# Patient Record
Sex: Female | Born: 1995 | Hispanic: Yes | Smoking: Never smoker
Health system: Southern US, Community
[De-identification: ages and names within clinical notes are randomized; demographics above are authoritative.]

## PROBLEM LIST (undated history)

## (undated) ENCOUNTER — Inpatient Hospital Stay: Payer: Self-pay

## (undated) DIAGNOSIS — O24419 Gestational diabetes mellitus in pregnancy, unspecified control: Secondary | ICD-10-CM

## (undated) DIAGNOSIS — O200 Threatened abortion: Secondary | ICD-10-CM

## (undated) DIAGNOSIS — Z87440 Personal history of urinary (tract) infections: Secondary | ICD-10-CM

## (undated) DIAGNOSIS — Z8616 Personal history of COVID-19: Secondary | ICD-10-CM

## (undated) HISTORY — DX: Personal history of COVID-19: Z86.16

## (undated) HISTORY — DX: Personal history of urinary (tract) infections: Z87.440

## (undated) HISTORY — DX: Gestational diabetes mellitus in pregnancy, unspecified control: O24.419

## (undated) HISTORY — DX: Threatened abortion: O20.0

---
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
## 2014-10-06 ENCOUNTER — Emergency Department: Payer: Self-pay | Admitting: Emergency Medicine

## 2014-10-06 LAB — URINALYSIS, COMPLETE
BLOOD: NEGATIVE
Bilirubin,UR: NEGATIVE
Glucose,UR: NEGATIVE mg/dL (ref 0–75)
NITRITE: NEGATIVE
Ph: 6 (ref 4.5–8.0)
Protein: 100
Specific Gravity: 1.029 (ref 1.003–1.030)
Squamous Epithelial: 35
WBC UR: 26 /HPF (ref 0–5)

## 2014-10-06 LAB — CBC WITH DIFFERENTIAL/PLATELET
BASOS PCT: 0.6 %
Basophil #: 0.1 10*3/uL (ref 0.0–0.1)
Eosinophil #: 0 10*3/uL (ref 0.0–0.7)
Eosinophil %: 0.2 %
HCT: 37.6 % (ref 35.0–47.0)
HGB: 12.4 g/dL (ref 12.0–16.0)
LYMPHS PCT: 16.6 %
Lymphocyte #: 2.2 10*3/uL (ref 1.0–3.6)
MCH: 27.1 pg (ref 26.0–34.0)
MCHC: 32.9 g/dL (ref 32.0–36.0)
MCV: 82 fL (ref 80–100)
MONO ABS: 0.8 x10 3/mm (ref 0.2–0.9)
Monocyte %: 6.1 %
NEUTROS ABS: 10.3 10*3/uL — AB (ref 1.4–6.5)
NEUTROS PCT: 76.5 %
Platelet: 337 10*3/uL (ref 150–440)
RBC: 4.58 10*6/uL (ref 3.80–5.20)
RDW: 14.3 % (ref 11.5–14.5)
WBC: 13.5 10*3/uL — AB (ref 3.6–11.0)

## 2014-10-06 LAB — COMPREHENSIVE METABOLIC PANEL
ALBUMIN: 4.2 g/dL (ref 3.8–5.6)
ANION GAP: 9 (ref 7–16)
AST: 20 U/L (ref 0–26)
Alkaline Phosphatase: 66 U/L
BILIRUBIN TOTAL: 1.1 mg/dL — AB (ref 0.2–1.0)
BUN: 9 mg/dL (ref 7–18)
CHLORIDE: 106 mmol/L (ref 98–107)
CO2: 26 mmol/L (ref 21–32)
Calcium, Total: 8.5 mg/dL — ABNORMAL LOW (ref 9.0–10.7)
Creatinine: 0.68 mg/dL (ref 0.60–1.30)
GLUCOSE: 118 mg/dL — AB (ref 65–99)
Osmolality: 281 (ref 275–301)
POTASSIUM: 3.8 mmol/L (ref 3.5–5.1)
SGPT (ALT): 21 U/L
SODIUM: 141 mmol/L (ref 136–145)
Total Protein: 7.9 g/dL (ref 6.4–8.6)

## 2014-10-06 LAB — TROPONIN I

## 2014-10-06 LAB — LIPASE, BLOOD: Lipase: 143 U/L (ref 73–393)

## 2014-11-21 NOTE — L&D Delivery Note (Signed)
Obstetrical Delivery Note   Date of Delivery:   09/19/2015 Primary OB:   [facility] Gestational Age/EDD: 4467w3d Antepartum complications: GBS bacteruria, history of gonorrhea/chlamydia with negative test of cure, BMI 36  Delivered By:   Cornelia Copaharlie Gredmarie Delange, Jr. MD  Delivery Type:   spontaneous vaginal delivery  Delivery Details:   CTSP and fetus +4. Over two contractions, patient easily delivered infant, with nuchal cord x 1 reduced in the process. Peds present at delivery due to moderate meconium on prior AROM and cord immediately clamped and cut by father of baby. Left periurethral repaired with 3-0 vicryl in running fashion after 1% lidocaine infiltration. Anesthesia:    local Intrapartum complications: None GBS:    Positive and she received at least two doses of ampicillin Laceration:    Right periurethral (repaired) Episiotomy:    none Placenta:    Delivered and expressed via active management. Intact: yes. To pathology: no.  Estimated Blood Loss:  200mL  Baby:    Liveborn female, APGARs 7/9, weight 3360gm  Cornelia Copaharlie Dione Petron, Jr. MD [facility] Pager [phone]

## 2015-02-10 DIAGNOSIS — E663 Overweight: Secondary | ICD-10-CM | POA: Insufficient documentation

## 2015-02-10 LAB — OB RESULTS CONSOLE GBS: STREP GROUP B AG: POSITIVE

## 2015-02-11 LAB — OB RESULTS CONSOLE HEPATITIS B SURFACE ANTIGEN: Hepatitis B Surface Ag: NEGATIVE

## 2015-02-11 LAB — OB RESULTS CONSOLE RPR: RPR: NONREACTIVE

## 2015-02-11 LAB — OB RESULTS CONSOLE HIV ANTIBODY (ROUTINE TESTING): HIV: NONREACTIVE

## 2015-04-01 LAB — OB RESULTS CONSOLE VARICELLA ZOSTER ANTIBODY, IGG: Varicella: IMMUNE

## 2015-09-18 ENCOUNTER — Inpatient Hospital Stay
Admission: EM | Admit: 2015-09-18 | Discharge: 2015-09-21 | DRG: 775 | Disposition: A | Discharge status: Home / Self Care | Payer: Medicaid Other | Attending: Obstetrics and Gynecology | Admitting: Obstetrics and Gynecology

## 2015-09-18 ENCOUNTER — Observation Stay
Admission: EM | Admit: 2015-09-18 | Discharge: 2015-09-18 | Disposition: A | Discharge status: Home / Self Care | Payer: Medicaid Other | Source: Home / Self Care | Admitting: Obstetrics & Gynecology

## 2015-09-18 ENCOUNTER — Encounter: Payer: Self-pay | Admitting: *Deleted

## 2015-09-18 DIAGNOSIS — O99824 Streptococcus B carrier state complicating childbirth: Secondary | ICD-10-CM | POA: Diagnosis present

## 2015-09-18 DIAGNOSIS — Z3A40 40 weeks gestation of pregnancy: Secondary | ICD-10-CM

## 2015-09-18 DIAGNOSIS — O480 Post-term pregnancy: Secondary | ICD-10-CM | POA: Diagnosis present

## 2015-09-18 DIAGNOSIS — Z3493 Encounter for supervision of normal pregnancy, unspecified, third trimester: Secondary | ICD-10-CM

## 2015-09-18 LAB — TYPE AND SCREEN
ABO/RH(D): O POS
ANTIBODY SCREEN: NEGATIVE

## 2015-09-18 LAB — CBC
HCT: 33.2 % — ABNORMAL LOW (ref 35.0–47.0)
Hemoglobin: 10.9 g/dL — ABNORMAL LOW (ref 12.0–16.0)
MCH: 24.4 pg — ABNORMAL LOW (ref 26.0–34.0)
MCHC: 32.8 g/dL (ref 32.0–36.0)
MCV: 74.3 fL — ABNORMAL LOW (ref 80.0–100.0)
PLATELETS: 304 10*3/uL (ref 150–440)
RBC: 4.47 MIL/uL (ref 3.80–5.20)
RDW: 16 % — AB (ref 11.5–14.5)
WBC: 12.7 10*3/uL — AB (ref 3.6–11.0)

## 2015-09-18 MED ORDER — OXYTOCIN 40 UNITS IN LACTATED RINGERS INFUSION - SIMPLE MED
INTRAVENOUS | Status: AC
  Filled 2015-09-18: qty 1000

## 2015-09-18 MED ORDER — SODIUM CHLORIDE 0.9 % IV SOLN
2.0000 g | Freq: Once | INTRAVENOUS | Status: AC
  Administered 2015-09-18: 2 g via INTRAVENOUS
  Filled 2015-09-18: qty 2000

## 2015-09-18 MED ORDER — BUTORPHANOL TARTRATE 1 MG/ML IJ SOLN
INTRAMUSCULAR | Status: AC
  Administered 2015-09-18: 1 mg
  Filled 2015-09-18: qty 1

## 2015-09-18 MED ORDER — SODIUM CHLORIDE 0.9 % IJ SOLN
INTRAMUSCULAR | Status: AC
Start: 2015-09-18 — End: 2015-09-19
  Filled 2015-09-18: qty 3

## 2015-09-18 MED ORDER — LACTATED RINGERS IV SOLN
INTRAVENOUS | Status: DC
  Administered 2015-09-18 – 2015-09-19 (×2): via INTRAVENOUS

## 2015-09-18 MED ORDER — SODIUM CHLORIDE 0.9 % IV SOLN
1.0000 g | INTRAVENOUS | Status: DC
  Administered 2015-09-18: 1 g via INTRAVENOUS

## 2015-09-18 MED ORDER — LIDOCAINE HCL (PF) 1 % IJ SOLN
INTRAMUSCULAR | Status: AC
  Administered 2015-09-19: 5 mL via SUBCUTANEOUS
  Filled 2015-09-18: qty 30

## 2015-09-18 MED ORDER — OXYTOCIN 40 UNITS IN LACTATED RINGERS INFUSION - SIMPLE MED: 62.5000 mL/h | INTRAVENOUS | Status: DC

## 2015-09-18 MED ORDER — LIDOCAINE HCL (PF) 1 % IJ SOLN
30.0000 mL | INTRAMUSCULAR | Status: DC | PRN
  Administered 2015-09-19: 5 mL via SUBCUTANEOUS

## 2015-09-18 MED ORDER — CITRIC ACID-SODIUM CITRATE 334-500 MG/5ML PO SOLN: 30.0000 mL | ORAL | Status: DC | PRN

## 2015-09-18 MED ORDER — OXYTOCIN 10 UNIT/ML IJ SOLN
INTRAMUSCULAR | Status: AC
  Filled 2015-09-18: qty 2

## 2015-09-18 MED ORDER — MISOPROSTOL 200 MCG PO TABS
ORAL_TABLET | ORAL | Status: AC
  Filled 2015-09-18: qty 4

## 2015-09-18 MED ORDER — LACTATED RINGERS IV SOLN: 500.0000 mL | INTRAVENOUS | Status: DC | PRN

## 2015-09-18 MED ORDER — OXYTOCIN BOLUS FROM INFUSION
500.0000 mL | INTRAVENOUS | Status: DC
  Administered 2015-09-19: 500 mL via INTRAVENOUS

## 2015-09-18 MED ORDER — AMPICILLIN SODIUM 1 G IJ SOLR
INTRAMUSCULAR | Status: AC
  Administered 2015-09-18: 1 g via INTRAVENOUS
  Filled 2015-09-18: qty 1000

## 2015-09-18 MED ORDER — AMMONIA AROMATIC IN INHA
RESPIRATORY_TRACT | Status: AC
  Filled 2015-09-18: qty 10

## 2015-09-18 MED ORDER — BUTORPHANOL TARTRATE 1 MG/ML IJ SOLN: 1.0000 mg | Freq: Once | INTRAMUSCULAR | Status: DC

## 2015-09-18 MED ORDER — ONDANSETRON HCL 4 MG/2ML IJ SOLN: 4.0000 mg | Freq: Four times a day (QID) | INTRAMUSCULAR | Status: DC | PRN

## 2015-09-18 MED ORDER — ACETAMINOPHEN 325 MG PO TABS: 650.0000 mg | ORAL_TABLET | ORAL | Status: DC | PRN

## 2015-09-18 NOTE — Discharge Summary (Signed)
Patient presented for evaluation of labor.  Patient had cervical exam by RN and this was reported to me. I reviewed her vital signs and fetal tracing, both of which were reassuring.  Patient was discharged as she was not laboring.  NST interpretation: Reactive, category 1  Ranae Plumberhelsea Mischa Brittingham, MD Attending Obstetrician and Gynecologist [facility]

## 2015-09-18 NOTE — H&P (Addendum)
OB H&P 19 y/o G1 @ 40/2, BMI 36, h/o GC/CT with TOC negative x 2  Office H&P reviewed and updates below Per RN, patient if 5cm. Category I NST (?one variable vs maternal), q1-4748m UCs Start amp for gbs bacteruria Pt declines pain medications EFW 3400gm Consider AROM if not progressing after amp #2.  Anticipate SVD  Mallory Shields, Jr MD [facility]  Pager: [phone]

## 2015-09-18 NOTE — OB Triage Note (Signed)
Discharge instructions given and pt made aware of induction appt for Nov 1 at 2000. Pt stated she understood. AVS given, no further questions at this time.

## 2015-09-18 NOTE — OB Triage Note (Addendum)
[facility] starting at 2100 on 09/17/15 having them 5 mins apart. Pt oriented to room and updated on POC. Pt stated last mean was at 1800. No ROM. Last cervical exam was 09/16/2015 and was dilated to 3 cm per pt report. Pt has  Had vaginal spotting since 0400 on 09/16/2015 and was seen at the office.   Will cont to monitor and update on POC.

## 2015-09-19 ENCOUNTER — Encounter: Payer: Self-pay | Admitting: Obstetrics and Gynecology

## 2015-09-19 LAB — ABO/RH: ABO/RH(D): O POS

## 2015-09-19 MED ORDER — ACETAMINOPHEN 325 MG PO TABS: 650.0000 mg | ORAL_TABLET | ORAL | Status: DC | PRN

## 2015-09-19 MED ORDER — MEASLES, MUMPS & RUBELLA VAC ~~LOC~~ INJ
0.5000 mL | INJECTION | Freq: Once | SUBCUTANEOUS | Status: AC
  Administered 2015-09-21: 0.5 mL via SUBCUTANEOUS
  Filled 2015-09-19 (×6): qty 0.5

## 2015-09-19 MED ORDER — MAGNESIUM HYDROXIDE 400 MG/5ML PO SUSP: 30.0000 mL | ORAL | Status: DC | PRN

## 2015-09-19 MED ORDER — LANOLIN HYDROUS EX OINT: TOPICAL_OINTMENT | CUTANEOUS | Status: DC | PRN

## 2015-09-19 MED ORDER — WITCH HAZEL-GLYCERIN EX PADS: 1.0000 "application " | MEDICATED_PAD | CUTANEOUS | Status: DC | PRN

## 2015-09-19 MED ORDER — ONDANSETRON HCL 4 MG/2ML IJ SOLN: 4.0000 mg | INTRAMUSCULAR | Status: DC | PRN

## 2015-09-19 MED ORDER — OXYCODONE-ACETAMINOPHEN 5-325 MG PO TABS: 1.0000 | ORAL_TABLET | Freq: Four times a day (QID) | ORAL | Status: DC | PRN

## 2015-09-19 MED ORDER — PRENATAL MULTIVITAMIN CH
1.0000 | ORAL_TABLET | Freq: Every day | ORAL | Status: DC
  Administered 2015-09-19 – 2015-09-21 (×3): 1 via ORAL
  Filled 2015-09-19 (×3): qty 1

## 2015-09-19 MED ORDER — IBUPROFEN 600 MG PO TABS
600.0000 mg | ORAL_TABLET | Freq: Four times a day (QID) | ORAL | Status: DC
  Administered 2015-09-19 – 2015-09-21 (×7): 600 mg via ORAL
  Filled 2015-09-19 (×7): qty 1

## 2015-09-19 MED ORDER — DIBUCAINE 1 % RE OINT: 1.0000 "application " | TOPICAL_OINTMENT | RECTAL | Status: DC | PRN

## 2015-09-19 MED ORDER — DIPHENHYDRAMINE HCL 25 MG PO CAPS: 25.0000 mg | ORAL_CAPSULE | Freq: Four times a day (QID) | ORAL | Status: DC | PRN

## 2015-09-19 MED ORDER — SIMETHICONE 80 MG PO CHEW: 80.0000 mg | CHEWABLE_TABLET | ORAL | Status: DC | PRN

## 2015-09-19 MED ORDER — ONDANSETRON HCL 4 MG PO TABS: 4.0000 mg | ORAL_TABLET | ORAL | Status: DC | PRN

## 2015-09-19 MED ORDER — INFLUENZA VAC SPLIT QUAD 0.5 ML IM SUSY: 0.5000 mL | PREFILLED_SYRINGE | INTRAMUSCULAR | Status: DC

## 2015-09-19 MED ORDER — BENZOCAINE-MENTHOL 20-0.5 % EX AERO
1.0000 "application " | INHALATION_SPRAY | CUTANEOUS | Status: DC | PRN
  Administered 2015-09-19: 1 via TOPICAL
  Filled 2015-09-19: qty 56

## 2015-09-19 NOTE — Discharge Summary (Signed)
Obstetrical Discharge Summary  Date of Admission: 09/18/2015 Date of Discharge: 09/21/2015  Primary OB: [facility]  Gestational Age at Delivery: 3826w3d   Antepartum complications: GBS bacteruria, history of gonorrhea/chlamydia with negative test of cure, BMI 36 Reason for Admission: active labor Date of Delivery: 09/19/2015 Delivered By: Cornelia Copaharlie Pickens, Jr MD Delivery Type: spontaneous vaginal delivery Intrapartum complications/course: None Anesthesia: local Placenta: expressed. Intact: yes. To pathology: no.  Laceration: right periurethral (repaired) Episiotomy: none Baby: Liveborn female, APGARs7/9, weight 3360 g. Kaylee  Postpartum course: Routine Disposition: Home  Rh Immune globulin given: not applicable Rubella vaccine given: ordered pp Tdap vaccine given in AP or PP setting: yes Flu vaccine given in AP or PP setting: ordered pp  Contraception: barrier method  Prenatal/Postnatal Panel: O POS//Rubella Not immune//Varicella Immune//RPR negative//HIV negative/HepB Surface Ag negative//pap not applicable //plans to breastfeed  Plan:  Mallory Shields was discharged to home in good condition. Follow-up appointment with Dr. Vergie LivingPickens in 4 weeks  Discharge Medications:   Medication List    TAKE these medications        benzocaine-Menthol 20-0.5 % Aero  Commonly known as:  DERMOPLAST  Apply 1 application topically as needed for irritation (perineal discomfort).     ibuprofen 600 MG tablet  Commonly known as:  ADVIL,MOTRIN  Take 1 tablet (600 mg total) by mouth every 6 (six) hours.     lanolin Oint  Apply 1 application topically as needed (for breast care).     prenatal multivitamin Tabs tablet  Take 1 tablet by mouth daily at 12 noon.       Farrel ConnersGUTIERREZ, Travious Vanover, CNM

## 2015-09-19 NOTE — Progress Notes (Signed)
L&D Note  09/19/2015 - 1:22 AM  19 y.o. G1 1026w3d. Pregnancy complicated by BMI 36, GBS bacteruria, h/o GC/CT with negative TOC  Ms. Walden FieldSandy Alejo Jimenez is admitted for active, term labor   Subjective:  Still feeling regular UCs Objective:    Current Vital Signs 24h Vital Sign Ranges  T 98.1 F (36.7 C) Temp  Avg: 97.8 F (36.6 C)  Min: 97.6 F (36.4 C)  Max: 98.1 F (36.7 C)  BP 122/70 mmHg BP  Min: 122/79  Max: 122/79  HR 90 Pulse  Avg: 84  Min: 78  Max: 90  RR  16 No Data Recorded  SaO2    98/RA No Data Recorded       24 Hour I/O Current Shift I/O  Time Ins Outs       FHR: 150 baseline, no accels, had a few subtle late decels hours ago, mod variability Toco: q1-1865m Gen: Mild distress with UCs SVE: pt amenable to AROM-->moderate mec 8/80/0, no caput. 7/100/0 @ 2315 per RN   Recent Labs Lab 09/18/15 1951  WBC 12.7*  HGB 10.9*  HCT 33.2*  PLT 304   Medications Current Facility-Administered Medications  Medication Dose Route Frequency Provider Last Rate Last Dose  . acetaminophen (TYLENOL) tablet 650 mg  650 mg Oral Q4H PRN [place] Bingharlie Ardena Gangl, MD      . ammonia inhalant           . ampicillin (OMNIPEN) 1 g in sodium chloride 0.9 % 50 mL IVPB  1 g Intravenous 6 times per day [place] Bingharlie Tichina Koebel, MD   1 g at 09/18/15 2347  . butorphanol (STADOL) injection 1 mg  1 mg Intravenous Once [place] Bingharlie Lizmary Nader, MD      . citric acid-sodium citrate (ORACIT) solution 30 mL  30 mL Oral Q2H PRN [place] Bingharlie Quinta Eimer, MD      . lactated ringers infusion 500-1,000 mL  500-1,000 mL Intravenous PRN [place] Bingharlie Unknown Schleyer, MD      . lactated ringers infusion   Intravenous Continuous [place] Bingharlie Rishan Oyama, MD 125 mL/hr at 09/18/15 1952    . lidocaine (PF) (XYLOCAINE) 1 % injection 30 mL  30 mL Subcutaneous PRN [place] Bingharlie Terryn Redner, MD      . lidocaine (PF) (XYLOCAINE) 1 % injection           . misoprostol (CYTOTEC) 200 MCG tablet           . ondansetron (ZOFRAN) injection 4 mg  4 mg Intravenous Q6H PRN [place] Bingharlie Jonelle Bann,  MD      . oxytocin (PITOCIN) 10 UNIT/ML injection           . oxytocin (PITOCIN) IV BOLUS FROM BAG  500 mL Intravenous Continuous [place] Bingharlie Essam Lowdermilk, MD      . oxytocin (PITOCIN) IV infusion 40 units in LR 1000 mL  62.5 mL/hr Intravenous Continuous [place] Bingharlie Anyiah Coverdale, MD      . oxytocin 40 units in LR 1000 mL 40 units/1000 mL infusion           . sodium chloride 0.9 % injection             Assessment & Plan:  Pt doing well *IUP: category I tracing currently *GBS: s/p amp x 2 *labor: pt amenable to pitocin augmentation prn. Anticipate SVD -peds @ delivery for moderate mec *Analgesia: no needs currently; would off on future IV PRNs given close to delivery  Cornelia Copaharlie Trenae Brunke, Jr MD [facility] Pager [phone]

## 2015-09-19 NOTE — Progress Notes (Signed)
Admit Date: 09/18/2015 Today's Date: 09/19/2015  Post Partum Day 1  Subjective:  no complaints, up ad lib, voiding and tolerating PO  Objective: Temp:  [97.6 F (36.4 C)-98.4 F (36.9 C)] 97.9 F (36.6 C) (10/29 0717) Pulse Rate:  [79-90] 82 (10/29 0717) Resp:  [18] 18 (10/29 0717) BP: (106-122)/(59-70) 107/60 mmHg (10/29 0717) SpO2:  [99 %] 99 % (10/29 0717)  Physical Exam:  General: alert, cooperative and no distress Lochia: appropriate Uterine Fundus: firm Incision: none DVT Evaluation: No evidence of DVT seen on physical exam.   Recent Labs  09/18/15 1951  HGB 10.9*  HCT 33.2*    Assessment/Plan: Plan for discharge tomorrow, Breastfeeding and Infant doing well  Uncertain contraception MMR ordered   LOS: 1 day   [place] 09/19/2015, 11:39 AM

## 2015-09-19 NOTE — Discharge Instructions (Signed)
Vaginal Delivery, Care After Refer to this sheet in the next few weeks. These discharge instructions provide you with information on caring for yourself after delivery. Your caregiver may also give you specific instructions. Your treatment has been planned according to the most current medical practices available, but problems sometimes occur. Call your caregiver if you have any problems or questions after you go home. HOME CARE INSTRUCTIONS 1. Take over-the-counter or prescription medicines only as directed by your caregiver or pharmacist. 2. Do not drink alcohol, especially if you are breastfeeding or taking medicine to relieve pain. 3. Do not smoke tobacco. 4. Continue to use good perineal care. Good perineal care includes: 1. Wiping your perineum from back to front 2. Keeping your perineum clean. 3. You can do sitz baths twice a day, to help keep this area clean 5. Do not use tampons, douche or have sex until your caregiver says it is okay. 6. Shower only and avoid sitting in submerged water, aside from sitz baths 7. Wear a well-fitting bra that provides breast support. 8. Eat healthy foods. 9. Drink enough fluids to keep your urine clear or pale yellow. 10. Eat high-fiber foods such as whole grain cereals and breads, brown rice, beans, and fresh fruits and vegetables every day. These foods may help prevent or relieve constipation. 11. Avoid constipation with high fiber foods or medications, such as miralax or metamucil 12. Follow your caregiver's recommendations regarding resumption of activities such as climbing stairs, driving, lifting, exercising, or traveling. 13. Talk to your caregiver about resuming sexual activities. Resumption of sexual activities is dependent upon your risk of infection, your rate of healing, and your comfort and desire to resume sexual activity. 14. Try to have someone help you with your household activities and your newborn for at least a few days after you leave  the hospital. 15. Rest as much as possible. Try to rest or take a nap when your newborn is sleeping. 16. Increase your activities gradually. 17. Keep all of your scheduled postpartum appointments. It is very important to keep your scheduled follow-up appointments. At these appointments, your caregiver will be checking to make sure that you are healing physically and emotionally. SEEK MEDICAL CARE IF:   You are passing large clots from your vagina. Save any clots to show your caregiver.  You have a foul smelling discharge from your vagina.  You have trouble urinating.  You are urinating frequently.  You have pain when you urinate.  You have a change in your bowel movements.  You have increasing redness, pain, or swelling near your vaginal incision (episiotomy) or vaginal tear.  You have pus draining from your episiotomy or vaginal tear.  Your episiotomy or vaginal tear is separating.  You have painful, hard, or reddened breasts.  You have a severe headache.  You have blurred vision or see spots.  You feel sad or depressed.  You have thoughts of hurting yourself or your newborn.  You have questions about your care, the care of your newborn, or medicines.  You are dizzy or light-headed.  You have a rash.  You have nausea or vomiting.  You were breastfeeding and have not had a menstrual period within 12 weeks after you stopped breastfeeding.  You are not breastfeeding and have not had a menstrual period by the 12th week after delivery.  You have a fever. SEEK IMMEDIATE MEDICAL CARE IF:   You have persistent pain.  You have chest pain.  You have shortness of breath.    You faint.  You have leg pain.  You have stomach pain.  Your vaginal bleeding saturates two or more sanitary pads in 1 hour. MAKE SURE YOU:   Understand these instructions.  Will watch your condition.  Will get help right away if you are not doing well or get worse. Document Released:  11/04/2000 Document Revised: 03/24/2014 Document Reviewed: 07/04/2012 ExitCare Patient Information 2015 [employer]. This information is not intended to replace advice given to you by your health care provider. Make sure you discuss any questions you have with your health care provider.  Sitz Bath A sitz bath is a warm water bath taken in the sitting position. The water covers only the hips and butt (buttocks). We recommend using one that fits in the toilet, to help with ease of use and cleanliness. It may be used for either healing or cleaning purposes. Sitz baths are also used to relieve pain, itching, or muscle tightening (spasms). The water may contain medicine. Moist heat will help you heal and relax.  HOME CARE  Take 3 to 4 sitz baths a day. 18. Fill the bathtub half-full with warm water. 19. Sit in the water and open the drain a little. 20. Turn on the warm water to keep the tub half-full. Keep the water running constantly. 21. Soak in the water for 15 to 20 minutes. 22. After the sitz bath, pat the affected area dry. GET HELP RIGHT AWAY IF: You get worse instead of better. Stop the sitz baths if you get worse. MAKE SURE YOU:  Understand these instructions.  Will watch your condition.  Will get help right away if you are not doing well or get worse. Document Released: 12/15/2004 Document Revised: 08/01/2012 Document Reviewed: 03/07/2011 ExitCare Patient Information 2015 [employer]. This information is not intended to replace advice given to you by your health care provider. Make sure you discuss any questions you have with your health care provider.    

## 2015-09-20 LAB — RPR: RPR: NONREACTIVE

## 2015-09-20 NOTE — Progress Notes (Signed)
Admit Date: 09/18/2015 Today's Date: 09/20/2015  Post Partum Day 1  Subjective:  no complaints, up ad lib, voiding and tolerating PO  Objective: Temp:  [98.2 F (36.8 C)-98.9 F (37.2 C)] 98.2 F (36.8 C) (10/30 0924) Pulse Rate:  [74-88] 85 (10/30 0924) Resp:  [18-20] 18 (10/30 0924) BP: (102-114)/(48-74) 114/74 mmHg (10/30 0924) SpO2:  [99 %] 99 % (10/30 0924)  Physical Exam:  General: alert, cooperative and no distress Lochia: appropriate Uterine Fundus: firm Incision: none DVT Evaluation: No evidence of DVT seen on physical exam.  Assessment/Plan: Plan for discharge tomorrow  Breastfeeding and Infant doing well although has jaundice and is under UV light therapy today  Uncertain contraception MMR ordered   LOS: 2 days   [place] 09/20/2015, 10:44 AM

## 2015-09-21 MED ORDER — LANOLIN HYDROUS EX OINT: 1.0000 "application " | TOPICAL_OINTMENT | CUTANEOUS | Status: DC | PRN

## 2015-09-21 MED ORDER — PRENATAL MULTIVITAMIN CH
1.0000 | ORAL_TABLET | Freq: Every day | ORAL | Status: DC
Start: 2015-09-21 — End: 2015-11-10

## 2015-09-21 MED ORDER — IBUPROFEN 600 MG PO TABS: 600.0000 mg | ORAL_TABLET | Freq: Four times a day (QID) | ORAL | Status: DC

## 2015-09-21 MED ORDER — BENZOCAINE-MENTHOL 20-0.5 % EX AERO: 1.0000 "application " | INHALATION_SPRAY | CUTANEOUS | Status: DC | PRN

## 2015-09-21 NOTE — Progress Notes (Signed)
Discharge instructions given. Patient discharged to home at 1755.

## 2015-10-30 ENCOUNTER — Inpatient Hospital Stay
Admission: EM | Admit: 2015-10-30 | Discharge: 2015-11-06 | DRG: 418 | Disposition: A | Discharge status: Home / Self Care | Payer: Medicaid Other | Attending: Internal Medicine | Admitting: Internal Medicine

## 2015-10-30 ENCOUNTER — Emergency Department: Payer: Medicaid Other

## 2015-10-30 ENCOUNTER — Encounter: Payer: Self-pay | Admitting: Emergency Medicine

## 2015-10-30 DIAGNOSIS — K861 Other chronic pancreatitis: Secondary | ICD-10-CM

## 2015-10-30 DIAGNOSIS — K567 Ileus, unspecified: Secondary | ICD-10-CM | POA: Diagnosis present

## 2015-10-30 DIAGNOSIS — Z6830 Body mass index (BMI) 30.0-30.9, adult: Secondary | ICD-10-CM

## 2015-10-30 DIAGNOSIS — R188 Other ascites: Secondary | ICD-10-CM | POA: Diagnosis present

## 2015-10-30 DIAGNOSIS — R51 Headache: Secondary | ICD-10-CM | POA: Diagnosis present

## 2015-10-30 DIAGNOSIS — N209 Urinary calculus, unspecified: Secondary | ICD-10-CM

## 2015-10-30 DIAGNOSIS — K859 Acute pancreatitis without necrosis or infection, unspecified: Secondary | ICD-10-CM | POA: Diagnosis not present

## 2015-10-30 DIAGNOSIS — N390 Urinary tract infection, site not specified: Secondary | ICD-10-CM | POA: Diagnosis present

## 2015-10-30 DIAGNOSIS — K851 Biliary acute pancreatitis without necrosis or infection: Secondary | ICD-10-CM | POA: Diagnosis present

## 2015-10-30 DIAGNOSIS — R42 Dizziness and giddiness: Secondary | ICD-10-CM | POA: Diagnosis present

## 2015-10-30 DIAGNOSIS — E669 Obesity, unspecified: Secondary | ICD-10-CM | POA: Diagnosis present

## 2015-10-30 DIAGNOSIS — R197 Diarrhea, unspecified: Secondary | ICD-10-CM | POA: Diagnosis present

## 2015-10-30 DIAGNOSIS — R000 Tachycardia, unspecified: Secondary | ICD-10-CM | POA: Diagnosis not present

## 2015-10-30 DIAGNOSIS — R1011 Right upper quadrant pain: Secondary | ICD-10-CM | POA: Diagnosis present

## 2015-10-30 DIAGNOSIS — K831 Obstruction of bile duct: Secondary | ICD-10-CM

## 2015-10-30 DIAGNOSIS — R140 Abdominal distension (gaseous): Secondary | ICD-10-CM

## 2015-10-30 DIAGNOSIS — K8020 Calculus of gallbladder without cholecystitis without obstruction: Secondary | ICD-10-CM | POA: Diagnosis not present

## 2015-10-30 DIAGNOSIS — K8062 Calculus of gallbladder and bile duct with acute cholecystitis without obstruction: Secondary | ICD-10-CM | POA: Diagnosis present

## 2015-10-30 DIAGNOSIS — R748 Abnormal levels of other serum enzymes: Secondary | ICD-10-CM | POA: Diagnosis present

## 2015-10-30 LAB — COMPREHENSIVE METABOLIC PANEL
ALBUMIN: 4.6 g/dL (ref 3.5–5.0)
ALK PHOS: 170 U/L — AB (ref 38–126)
ALT: 633 U/L — ABNORMAL HIGH (ref 14–54)
ANION GAP: 9 (ref 5–15)
AST: 789 U/L — ABNORMAL HIGH (ref 15–41)
BILIRUBIN TOTAL: 4 mg/dL — AB (ref 0.3–1.2)
BUN: 7 mg/dL (ref 6–20)
CALCIUM: 9.7 mg/dL (ref 8.9–10.3)
CO2: 24 mmol/L (ref 22–32)
Chloride: 105 mmol/L (ref 101–111)
Creatinine, Ser: 0.41 mg/dL — ABNORMAL LOW (ref 0.44–1.00)
GFR calc Af Amer: >60 mL/min (ref >60)
GLUCOSE: 181 mg/dL — AB (ref 65–99)
POTASSIUM: 4.5 mmol/L (ref 3.5–5.1)
Sodium: 138 mmol/L (ref 135–145)
TOTAL PROTEIN: 8 g/dL (ref 6.5–8.1)

## 2015-10-30 LAB — CBC
HEMATOCRIT: 38.9 % (ref 35.0–47.0)
HEMOGLOBIN: 12.4 g/dL (ref 12.0–16.0)
MCH: 24.4 pg — ABNORMAL LOW (ref 26.0–34.0)
MCHC: 32 g/dL (ref 32.0–36.0)
MCV: 76.4 fL — ABNORMAL LOW (ref 80.0–100.0)
Platelets: 250 10*3/uL (ref 150–440)
RBC: 5.09 MIL/uL (ref 3.80–5.20)
RDW: 18.9 % — AB (ref 11.5–14.5)
WBC: 8.7 10*3/uL (ref 3.6–11.0)

## 2015-10-30 LAB — URINALYSIS COMPLETE WITH MICROSCOPIC (ARMC ONLY)
Bacteria, UA: NONE SEEN
GLUCOSE, UA: NEGATIVE mg/dL
HGB URINE DIPSTICK: NEGATIVE
KETONES UR: NEGATIVE mg/dL
LEUKOCYTES UA: NEGATIVE
NITRITE: NEGATIVE
Protein, ur: NEGATIVE mg/dL
SPECIFIC GRAVITY, URINE: 1.018 (ref 1.005–1.030)
pH: 6 (ref 5.0–8.0)

## 2015-10-30 LAB — POCT PREGNANCY, URINE: PREG TEST UR: NEGATIVE

## 2015-10-30 LAB — LIPASE, BLOOD: Lipase: 3469 U/L — ABNORMAL HIGH (ref 11–51)

## 2015-10-30 LAB — MAGNESIUM: MAGNESIUM: 1.8 mg/dL (ref 1.7–2.4)

## 2015-10-30 MED ORDER — OXYCODONE HCL 5 MG PO TABS
5.0000 mg | ORAL_TABLET | ORAL | Status: DC | PRN
  Administered 2015-10-30 – 2015-11-06 (×10): 5 mg via ORAL
  Filled 2015-10-30 (×11): qty 1

## 2015-10-30 MED ORDER — POTASSIUM CHLORIDE IN NACL 20-0.9 MEQ/L-% IV SOLN
INTRAVENOUS | Status: DC
  Administered 2015-10-30 – 2015-11-06 (×17): via INTRAVENOUS
  Filled 2015-10-30 (×26): qty 1000

## 2015-10-30 MED ORDER — ONDANSETRON HCL 4 MG/2ML IJ SOLN
4.0000 mg | Freq: Four times a day (QID) | INTRAMUSCULAR | Status: DC | PRN
  Administered 2015-11-05: 4 mg via INTRAVENOUS

## 2015-10-30 MED ORDER — ONDANSETRON HCL 4 MG/2ML IJ SOLN
4.0000 mg | Freq: Once | INTRAMUSCULAR | Status: AC
  Administered 2015-10-30: 4 mg via INTRAVENOUS
  Filled 2015-10-30: qty 2

## 2015-10-30 MED ORDER — MORPHINE SULFATE (PF) 4 MG/ML IV SOLN
4.0000 mg | INTRAVENOUS | Status: DC | PRN
  Administered 2015-10-30 – 2015-10-31 (×5): 4 mg via INTRAVENOUS
  Filled 2015-10-30 (×4): qty 1

## 2015-10-30 MED ORDER — ALBUTEROL SULFATE (2.5 MG/3ML) 0.083% IN NEBU: 2.5000 mg | INHALATION_SOLUTION | RESPIRATORY_TRACT | Status: DC | PRN

## 2015-10-30 MED ORDER — HEPARIN SODIUM (PORCINE) 5000 UNIT/ML IJ SOLN
5000.0000 [IU] | Freq: Three times a day (TID) | INTRAMUSCULAR | Status: DC
  Administered 2015-10-30 – 2015-11-02 (×8): 5000 [IU] via SUBCUTANEOUS
  Filled 2015-10-30 (×8): qty 1

## 2015-10-30 MED ORDER — ACETAMINOPHEN 325 MG PO TABS
650.0000 mg | ORAL_TABLET | Freq: Four times a day (QID) | ORAL | Status: DC | PRN
  Administered 2015-11-03: 650 mg via ORAL
  Filled 2015-10-30: qty 2

## 2015-10-30 MED ORDER — HYDROMORPHONE HCL 1 MG/ML IJ SOLN
1.0000 mg | Freq: Once | INTRAMUSCULAR | Status: AC
  Administered 2015-10-30: 1 mg via INTRAVENOUS
  Filled 2015-10-30: qty 1

## 2015-10-30 MED ORDER — ACETAMINOPHEN 650 MG RE SUPP
650.0000 mg | Freq: Four times a day (QID) | RECTAL | Status: DC | PRN
Start: 2015-10-30 — End: 2015-11-06
  Administered 2015-11-02: 650 mg via RECTAL
  Filled 2015-10-30: qty 1

## 2015-10-30 MED ORDER — SODIUM CHLORIDE 0.9 % IV BOLUS (SEPSIS)
1000.0000 mL | Freq: Once | INTRAVENOUS | Status: AC
  Administered 2015-10-30: 1000 mL via INTRAVENOUS

## 2015-10-30 MED ORDER — IOHEXOL 240 MG/ML SOLN: 25.0000 mL | Freq: Once | INTRAMUSCULAR | Status: DC | PRN

## 2015-10-30 MED ORDER — CEFTRIAXONE SODIUM 1 G IJ SOLR
1.0000 g | INTRAMUSCULAR | Status: DC
  Administered 2015-10-31 – 2015-11-03 (×3): 1 g via INTRAVENOUS
  Filled 2015-10-30 (×5): qty 10

## 2015-10-30 MED ORDER — IOHEXOL 240 MG/ML SOLN: 50.0000 mL | Freq: Once | INTRAMUSCULAR | Status: DC | PRN

## 2015-10-30 MED ORDER — MORPHINE SULFATE (PF) 4 MG/ML IV SOLN
INTRAVENOUS | Status: AC
  Administered 2015-10-30: 4 mg via INTRAVENOUS
  Filled 2015-10-30: qty 1

## 2015-10-30 MED ORDER — ONDANSETRON HCL 4 MG PO TABS
4.0000 mg | ORAL_TABLET | Freq: Four times a day (QID) | ORAL | Status: DC | PRN
Start: 2015-10-30 — End: 2015-11-06

## 2015-10-30 NOTE — Plan of Care (Signed)
Problem: Nutritional: Goal: Ability to achieve adequate nutritional intake will improve Outcome: Not Progressing Pt currently NPO

## 2015-10-30 NOTE — Progress Notes (Signed)
Pt. Agreed to drink contrast.

## 2015-10-30 NOTE — ED Notes (Signed)
Epigastric abd pain x1 day , vomiting bile today , pt appearance uncomfortable , rocking back and forth in triage

## 2015-10-30 NOTE — H&P (Signed)
[facility] [place] [facility]   PATIENT NAME: Mallory Shields    MR#:  [id number]  DATE OF BIRTH:  11/28/1995  DATE OF ADMISSION:  10/30/2015  PRIMARY CARE PHYSICIAN: No primary care provider on file.   REQUESTING/REFERRING PHYSICIAN: Jennye MoccasinBrian S Quigley, MD  CHIEF COMPLAINT:   Chief Complaint  Patient presents with  . Abdominal Pain   Abdominal pain, nausea and vomiting for 2 days. HISTORY OF PRESENT ILLNESS:  Mallory Shields  is a 19 y.o. female with no past medical history. She presented to the ED with abdominal pain, nausea, vomiting for the past 2 days. Abdominal pain is in epigastric area, intermittent, 8 out of 10 without radiation. The patient also has had the nausea, vomiting for the past 2 days with poor oral intake. Patient had diarrhea twice today. She denies any fever or chills but has headache and dizziness and weakness. She denies any dark urine or jaundice. Her lipase is elevated at 3469.  PAST MEDICAL HISTORY:  History reviewed. No pertinent past medical history.  PAST SURGICAL HISTORY:  History reviewed. No pertinent past surgical history.  SOCIAL HISTORY:   Social History  Substance Use Topics  . Smoking status: Never Smoker   . Smokeless tobacco: Never Used  . Alcohol Use: No    FAMILY HISTORY:   Family History  Problem Relation Age of Onset  . Miscarriages / Stillbirths Neg Hx   . Gallstones Mother   . Gallstones Father   . Gallstones Sister     DRUG ALLERGIES:  No Known Allergies  REVIEW OF SYSTEMS:  CONSTITUTIONAL: No fever, but has poor oral intake and generalized weakness.  EYES: No blurred or double vision.  EARS, NOSE, AND THROAT: No tinnitus or ear pain.  RESPIRATORY: No cough, shortness of breath, wheezing or hemoptysis.  CARDIOVASCULAR: No chest pain, orthopnea, edema.  GASTROINTESTINAL: Has nausea, vomiting, diarrhea and abdominal pain.  GENITOURINARY: No dysuria, hematuria.  ENDOCRINE: No  polyuria, nocturia,  HEMATOLOGY: No anemia, easy bruising or bleeding SKIN: No rash or lesion. MUSCULOSKELETAL: No joint pain or arthritis.   NEUROLOGIC: No tingling, numbness, weakness.  PSYCHIATRY: No anxiety or depression.   MEDICATIONS AT HOME:   Prior to Admission medications   Medication Sig Start Date End Date Taking? Authorizing Provider  benzocaine-Menthol (DERMOPLAST) 20-0.5 % AERO Apply 1 application topically as needed for irritation (perineal discomfort). Patient not taking: Reported on 10/30/2015 09/21/15   Farrel Connersolleen Gutierrez, CNM  ibuprofen (ADVIL,MOTRIN) 600 MG tablet Take 1 tablet (600 mg total) by mouth every 6 (six) hours. Patient not taking: Reported on 10/30/2015 09/21/15   Farrel Connersolleen Gutierrez, CNM  lanolin OINT Apply 1 application topically as needed (for breast care). Patient not taking: Reported on 10/30/2015 09/21/15   Farrel Connersolleen Gutierrez, CNM  Prenatal Vit-Fe Fumarate-FA (PRENATAL MULTIVITAMIN) TABS tablet Take 1 tablet by mouth daily at 12 noon. Patient not taking: Reported on 10/30/2015 09/21/15   Farrel Connersolleen Gutierrez, CNM      VITAL SIGNS:  Blood pressure 114/79, pulse 91, temperature 98.1 F (36.7 C), temperature source Oral, resp. rate 12, height 5\' 4"  (1.626 m), weight 89.812 kg (198 lb), SpO2 98 %, currently breastfeeding.  PHYSICAL EXAMINATION:  GENERAL:  19 y.o.-year-old patient lying in the bed with no acute distress. Obese. EYES: Pupils equal, round, reactive to light and accommodation. No scleral icterus. Extraocular muscles intact.  HEENT: Head atraumatic, normocephalic. Oropharynx and nasopharynx clear.  NECK:  Supple, no jugular venous distention. No thyroid enlargement, no  tenderness.  LUNGS: Normal breath sounds bilaterally, no wheezing, rales,rhonchi or crepitation. No use of accessory muscles of respiration.  CARDIOVASCULAR: S1, S2 normal. No murmurs, rubs, or gallops.  ABDOMEN: Soft, nontender, epigastric tenderness. Bowel sounds present. No  organomegaly or mass. Negative Murphy's sign. EXTREMITIES: No pedal edema, cyanosis, or clubbing.  NEUROLOGIC: Cranial nerves II through XII are intact. Muscle strength 5/5 in all extremities. Sensation intact. Gait not checked.  PSYCHIATRIC: The patient is alert and oriented x 3.  SKIN: No obvious rash, lesion, or ulcer.   LABORATORY PANEL:   CBC  Recent Labs Lab 10/30/15 1108  WBC 8.7  HGB 12.4  HCT 38.9  PLT 250   ------------------------------------------------------------------------------------------------------------------  Chemistries   Recent Labs Lab 10/30/15 1108  NA 138  K 4.5  CL 105  CO2 24  GLUCOSE 181*  BUN 7  CREATININE 0.41*  CALCIUM 9.7  AST 789*  ALT 633*  ALKPHOS 170*  BILITOT 4.0*   ------------------------------------------------------------------------------------------------------------------  Cardiac Enzymes No results for input(s): TROPONINI in the last 168 hours. ------------------------------------------------------------------------------------------------------------------  RADIOLOGY:  US Abdomen Limited Ruq  10/30/2015  CLINICAL DATA:  Right upper quadrant abdominal pain. EXAM: US ABDOMEN LIMITED - RIGHT UPPER QUADRANT COMPARISON:  10/06/2014 FINDINGS: Gallbladder: Multiple stones present in the gallbladder. These are small stones measuring 4 mm and less. Gallbladder wall thickness is borderline at 2.4 mm. No Murphy sign. Common bile duct: Diameter: 5-6 mm in diameter. No visible ductal stone. In a person of this age, this does raise some possibility of choledocholithiasis. Liver: No focal lesion identified. Within normal limits in parenchymal echogenicity. IMPRESSION: Chololithiasis. Borderline thick gallbladder wall. No sonographic Murphy's sign. Slight prominence of the common duct for a person of this age. No visible common duct stone, but the finding does raise the possibility of choledocholithiasis. Electronically Signed   By: Paulina Fusi M.D.   On: 10/30/2015 12:11    EKG:   Orders placed or performed in visit on 10/06/14  . EKG 12-Lead    IMPRESSION AND PLAN:   Acute pancreatitis. I will keep her nothing by mouth except medication and sips of water. Given normal saline with potassium IV fluid support, follow-up lipase, BMP, magnesium and a CBC.  Abnormal liver function test. Possible due to cholelithiasis. Start Rocephin and get  GI consult and surgical consult. Follow-up CMP. Cholelithiasis. Surgical consult.   All the records are reviewed and case discussed with ED provider. Management plans discussed with the patient, her fianc and they are in agreement.  CODE STATUS: Full code  TOTAL TIME TAKING CARE OF THIS PATIENT: 53 minutes.    Shaune Pollack M.D on 10/30/2015 at 2:47 PM  Between 7am to 6pm - Pager - [phone]  After 6pm go to [email or web address] - password [email or web address] [facility]  [place] [facility]  Office  [phone]  CC: Primary care physician; No primary care provider on file.

## 2015-10-30 NOTE — ED Notes (Signed)
Returned from U/S

## 2015-10-30 NOTE — ED Notes (Signed)
Michael on floor notified patient received morphine and was on her way to the floor

## 2015-10-30 NOTE — ED Notes (Signed)
Patient transported to Ultrasound 

## 2015-10-30 NOTE — Progress Notes (Signed)
Notified Dr. Excell Seltzerooper that pt was having emesis following contrast consumption this evening. New order received to have CT tomorrow morning.  Radiology called and suggested consuming first contrast starting at 9pm tonight and follow with second bottle in the am prior to CT scan. Continue to assess.

## 2015-10-30 NOTE — Progress Notes (Signed)
Pt. States she does not think she can drink the contrast at this time, she worried it will make her nauseated/vomit. Will attempt again in morning prior to CT.

## 2015-10-30 NOTE — ED Provider Notes (Signed)
Time Seen: Approximately ----------------------------------------- 11:38 AM on 10/30/2015 -----------------------------------------   I have reviewed the triage notes  Chief Complaint: Abdominal Pain   History of Present Illness: Mallory Shields is a 19 y.o. female who states that she had the initiation of nausea vomiting and epigastric abdominal pain over the last 24 hours. Denies any loose stool or diarrhea. She denies any melena or hematochezia. She is not aware of any fever at home. She points mainly to the epigastric area and then up into the right lower chest region. Denies any chest pain or shortness of breath or pleuritic or positional component. Patient denies any left-sided chest pain or abdominal pain. She states she vomited approximately 10 times this morning with no obvious blood but did have some green emesis. Patient is postpartum with delivery at the end of October. History reviewed. No pertinent past medical history.  Patient Active Problem List   Diagnosis Date Noted  . Postpartum care following vaginal delivery 09/18/2015    History reviewed. No pertinent past surgical history.  History reviewed. No pertinent past surgical history.  Current Outpatient Rx  Name  Route  Sig  Dispense  Refill  . benzocaine-Menthol (DERMOPLAST) 20-0.5 % AERO   Topical   Apply 1 application topically as needed for irritation (perineal discomfort). Patient not taking: Reported on 10/30/2015         . ibuprofen (ADVIL,MOTRIN) 600 MG tablet   Oral   Take 1 tablet (600 mg total) by mouth every 6 (six) hours. Patient not taking: Reported on 10/30/2015   30 tablet   0   . lanolin OINT   Topical   Apply 1 application topically as needed (for breast care). Patient not taking: Reported on 10/30/2015      0   . Prenatal Vit-Fe Fumarate-FA (PRENATAL MULTIVITAMIN) TABS tablet   Oral   Take 1 tablet by mouth daily at 12 noon. Patient not taking: Reported on 10/30/2015            Allergies:  Review of patient's allergies indicates no known allergies.  Family History: Family History  Problem Relation Age of Onset  . Miscarriages / Stillbirths Neg Hx     Social History: Social History  Substance Use Topics  . Smoking status: Never Smoker   . Smokeless tobacco: Never Used  . Alcohol Use: No     Review of Systems:   10 point review of systems was performed and was otherwise negative:  Constitutional: No fever Eyes: No visual disturbances ENT: No sore throat, ear pain Cardiac: Right side lower chest discomfort Respiratory: No shortness of breath, wheezing, or stridor Abdomen: No abdominal pain, no vomiting, No diarrhea Endocrine: No weight loss, No night sweats Extremities: No peripheral edema, cyanosis Skin: No rashes, easy bruising Neurologic: No focal weakness, trouble with speech or swollowing. Patient denies any significant headaches Urologic: No dysuria, Hematuria, or urinary frequency   Physical Exam:  ED Triage Vitals  Enc Vitals Group     BP 10/30/15 1038 146/114 mmHg     Pulse Rate 10/30/15 1038 66     Resp 10/30/15 1035 24     Temp 10/30/15 1035 98.1 F (36.7 C)     Temp Source 10/30/15 1035 Oral     SpO2 10/30/15 1035 100 %     Weight 10/30/15 1035 198 lb (89.812 kg)     Height 10/30/15 1035 5\' 4"  (1.626 m)     Head Cir --      Peak Flow --  Pain Score 10/30/15 1036 10     Pain Loc --      Pain Edu? --      Excl. in GC? --     General: Awake , Alert , and Oriented times 3; GCS 15 Head: Normal cephalic , atraumatic Eyes: Pupils equal , round, reactive to light Nose/Throat: No nasal drainage, patent upper airway without erythema or exudate.  Neck: Supple, Full range of motion, No anterior adenopathy or palpable thyroid masses Lungs: Clear to ascultation without wheezes , rhonchi, or rales Heart: Regular rate, regular rhythm without murmurs , gallops , or rubs Abdomen: Tender primarily in the epigastric area without  rebound, guarding , or rigidity; bowel sounds positive and symmetric in all 4 quadrants. No organomegaly .     Negative Murphy's. Negative tenderness over McBurney's point   Extremities: 2 plus symmetric pulses. No edema, clubbing or cyanosis Neurologic: normal ambulation, Motor symmetric without deficits, sensory intact Skin: warm, dry, no rashes   Labs:   All laboratory work was reviewed including any pertinent negatives or positives listed below:  Labs Reviewed  CBC - Abnormal; Notable for the following:    MCV 76.4 (*)    MCH 24.4 (*)    RDW 18.9 (*)    All other components within normal limits  LIPASE, BLOOD  COMPREHENSIVE METABOLIC PANEL  URINALYSIS COMPLETEWITH MICROSCOPIC ([facility] ONLY)  POC URINE PREG, ED   review of the laboratory work shows an elevated lipase indicative of pancreatitis. Given the other elevated liver function panel     Radiology    I personally reviewed the radiologic studies    Gallbladder:  Multiple stones present in the gallbladder. These are small stones measuring 4 mm and less. Gallbladder wall thickness is borderline at 2.4 mm. No Murphy sign.  Common bile duct:  Diameter: 5-6 mm in diameter. No visible ductal stone. In a person of this age, this does raise some possibility of choledocholithiasis.  Liver:  No focal lesion identified. Within normal limits in parenchymal echogenicity.  IMPRESSION: Chololithiasis. Borderline thick gallbladder wall. No sonographic Murphy's sign. Slight prominence of the common duct for a person of this age. No visible common duct stone, but the finding does raise the possibility of choledocholithiasis.    ED Course: * Differential diagnosis includes but is not exclusive to acute cholecystitis, intrathoracic causes for epigastric abdominal pain, gastritis, duodenitis, pancreatitis, small bowel or large bowel obstruction, abdominal aortic aneurysm, hernia, gastritis, etc. Given the patient's current  clinical presentation and objective findings I felt she had acute pancreatitis possibly secondary to acute cholelithiasis. Ultrasound is negative at this time.    Assessment: Acute pancreatitis   Final Clinical Impression:   Final diagnoses:  Right upper quadrant abdominal pain     Plan:  Inpatient management            Jennye Moccasin, MD 10/30/15 1438

## 2015-10-30 NOTE — Consult Note (Signed)
Surgical Consultation  10/30/2015  Mallory Shields is an 19 y.o. female.   CC: Epigastric pain  HPI: This patient with 1 day of epigastric pain she has never had an episode like this before she is 1 month postpartum. She has a diagnosis of acute biliary pancreatitis now was asked see the patient for choledocholithiasis as well. Patient denies fevers or chills has back pain and abdominal pain and nausea has had a normal bowel movement. Has had some dark urine.  History reviewed. No pertinent past medical history.  History reviewed. No pertinent past surgical history.  Family History  Problem Relation Age of Onset  . Miscarriages / Stillbirths Neg Hx   . Gallstones Mother   . Gallstones Father   . Gallstones Sister     Social History:  reports that she has never smoked. She has never used smokeless tobacco. She reports that she does not drink alcohol or use illicit drugs.  Allergies: No Known Allergies  Medications reviewed.   Review of Systems:   Review of Systems  Constitutional: Negative for fever, chills and weight loss.  HENT: Negative.   Eyes: Negative.   Respiratory: Negative for cough, hemoptysis and shortness of breath.   Cardiovascular: Negative.   Gastrointestinal: Positive for nausea and abdominal pain. Negative for heartburn, diarrhea, constipation, blood in stool and melena.  Genitourinary: Negative.   Musculoskeletal: Negative.   Skin: Negative.   Neurological: Negative.   Endo/Heme/Allergies: Negative.   Psychiatric/Behavioral: Negative.      Physical Exam:  BP 125/82 mmHg  Pulse 72  Temp(Src) 98.2 F (36.8 C) (Oral)  Resp 14  Ht $R'5\' 4"'jO$  (1.626 m)  Wt 179 lb (81.194 kg)  BMI 30.71 kg/m2  SpO2 100%  Breastfeeding? Yes  Physical Exam  Constitutional: She is oriented to person, place, and time. She appears distressed.  Uncomfortable  HENT:  Head: Normocephalic and atraumatic.  Eyes: Pupils are equal, round, and reactive to light. Right eye  exhibits no discharge. Left eye exhibits no discharge. Scleral icterus is present.  Neck: Normal range of motion.  Cardiovascular: Normal rate, regular rhythm and normal heart sounds.   Pulmonary/Chest: Effort normal and breath sounds normal. No respiratory distress. She has no wheezes. She has no rales.  Abdominal: Soft. She exhibits distension. There is tenderness. There is guarding. There is no rebound.  Tender in the epigastrium and right upper quadrant with some guarding in the epigastrium this extends also to the left upper quadrant. Postpartum abdomen  Musculoskeletal: Normal range of motion. She exhibits no edema.  Lymphadenopathy:    She has no cervical adenopathy.  Neurological: She is alert and oriented to person, place, and time.  Skin: Skin is warm and dry. She is not diaphoretic. No erythema.  Psychiatric: Mood and affect normal.      Results for orders placed or performed during the hospital encounter of 10/30/15 (from the past 48 hour(s))  Lipase, blood     Status: Abnormal   Collection Time: 10/30/15 11:08 AM  Result Value Ref Range   Lipase 3469 (H) 11 - 51 U/L    Comment: CRITICAL RESULT CALLED TO, READ BACK BY AND VERIFIED WITH KIM MAIN ON 10/30/15 AT [address] [facility]   Comprehensive metabolic panel     Status: Abnormal   Collection Time: 10/30/15 11:08 AM  Result Value Ref Range   Sodium 138 135 - 145 mmol/L   Potassium 4.5 3.5 - 5.1 mmol/L   Chloride 105 101 - 111 mmol/L   CO2  24 22 - 32 mmol/L   Glucose, Bld 181 (H) 65 - 99 mg/dL   BUN 7 6 - 20 mg/dL   Creatinine, Ser 0.41 (L) 0.44 - 1.00 mg/dL   Calcium 9.7 8.9 - 10.3 mg/dL   Total Protein 8.0 6.5 - 8.1 g/dL   Albumin 4.6 3.5 - 5.0 g/dL   AST 789 (H) 15 - 41 U/L   ALT 633 (H) 14 - 54 U/L   Alkaline Phosphatase 170 (H) 38 - 126 U/L   Total Bilirubin 4.0 (H) 0.3 - 1.2 mg/dL   GFR calc non Af Amer >60 >60 mL/min   GFR calc Af Amer >60 >60 mL/min    Comment: (NOTE) The eGFR has been calculated using the CKD EPI  equation. This calculation has not been validated in all clinical situations. eGFR's persistently <60 mL/min signify possible Chronic Kidney Disease.    Anion gap 9 5 - 15  CBC     Status: Abnormal   Collection Time: 10/30/15 11:08 AM  Result Value Ref Range   WBC 8.7 3.6 - 11.0 K/uL   RBC 5.09 3.80 - 5.20 MIL/uL   Hemoglobin 12.4 12.0 - 16.0 g/dL   HCT 38.9 35.0 - 47.0 %   MCV 76.4 (L) 80.0 - 100.0 fL   MCH 24.4 (L) 26.0 - 34.0 pg   MCHC 32.0 32.0 - 36.0 g/dL   RDW 18.9 (H) 11.5 - 14.5 %   Platelets 250 150 - 440 K/uL  Magnesium     Status: None   Collection Time: 10/30/15 11:08 AM  Result Value Ref Range   Magnesium 1.8 1.7 - 2.4 mg/dL  Urinalysis complete, with microscopic ([facility] only)     Status: Abnormal   Collection Time: 10/30/15  1:39 PM  Result Value Ref Range   Color, Urine AMBER (A) YELLOW   APPearance CLEAR (A) CLEAR   Glucose, UA NEGATIVE NEGATIVE mg/dL   Bilirubin Urine 1+ (A) NEGATIVE   Ketones, ur NEGATIVE NEGATIVE mg/dL   Specific Gravity, Urine 1.018 1.005 - 1.030   Hgb urine dipstick NEGATIVE NEGATIVE   pH 6.0 5.0 - 8.0   Protein, ur NEGATIVE NEGATIVE mg/dL   Nitrite NEGATIVE NEGATIVE   Leukocytes, UA NEGATIVE NEGATIVE   RBC / HPF 0-5 0 - 5 RBC/hpf   WBC, UA 0-5 0 - 5 WBC/hpf   Bacteria, UA NONE SEEN NONE SEEN   Squamous Epithelial / LPF 0-5 (A) NONE SEEN   Mucous PRESENT   Pregnancy, urine POC     Status: None   Collection Time: 10/30/15  1:53 PM  Result Value Ref Range   Preg Test, Ur NEGATIVE NEGATIVE    Comment:        THE SENSITIVITY OF THIS METHODOLOGY IS >24 mIU/mL    US Abdomen Limited Ruq  10/30/2015  CLINICAL DATA:  Right upper quadrant abdominal pain. EXAM: US ABDOMEN LIMITED - RIGHT UPPER QUADRANT COMPARISON:  10/06/2014 FINDINGS: Gallbladder: Multiple stones present in the gallbladder. These are small stones measuring 4 mm and less. Gallbladder wall thickness is borderline at 2.4 mm. No Murphy sign. Common bile duct: Diameter: 5-6 mm  in diameter. No visible ductal stone. In a person of this age, this does raise some possibility of choledocholithiasis. Liver: No focal lesion identified. Within normal limits in parenchymal echogenicity. IMPRESSION: Chololithiasis. Borderline thick gallbladder wall. No sonographic Murphy's sign. Slight prominence of the common duct for a person of this age. No visible common duct stone, but the finding does raise  the possibility of choledocholithiasis. Electronically Signed   By: Nelson Chimes M.D.   On: 10/30/2015 12:11    Assessment/Plan:  Acute biliary pancreatitis with normal calcium elevated lipase as well as AST ALT, bilirubin is 4.0  This represents biliary pancreatitis and choledocholithiasis and will ultimately require laparoscopic cholecystectomy with cholangiography. However the patient is very uncomfortable and has a elevated bilirubin suggesting stone impaction and may benefit from early ERCP and GI consultation. Follow the patient and her labs and considers laparoscopic cholecystectomy once her choledocholithiasis and pancreatitis is resolved.  Florene Glen, MD, FACS

## 2015-10-31 ENCOUNTER — Inpatient Hospital Stay: Payer: Medicaid Other

## 2015-10-31 ENCOUNTER — Encounter: Payer: Self-pay | Admitting: Radiology

## 2015-10-31 DIAGNOSIS — K851 Biliary acute pancreatitis without necrosis or infection: Secondary | ICD-10-CM | POA: Insufficient documentation

## 2015-10-31 LAB — COMPREHENSIVE METABOLIC PANEL
ALBUMIN: 3.3 g/dL — AB (ref 3.5–5.0)
ALK PHOS: 170 U/L — AB (ref 38–126)
ALT: 547 U/L — AB (ref 14–54)
ALT: 432 U/L — ABNORMAL HIGH (ref 14–54)
ANION GAP: 7 (ref 5–15)
AST: 364 U/L — ABNORMAL HIGH (ref 15–41)
AST: 231 U/L — ABNORMAL HIGH (ref 15–41)
Albumin: 3.5 g/dL (ref 3.5–5.0)
Alkaline Phosphatase: 162 U/L — ABNORMAL HIGH (ref 38–126)
Anion gap: 7 (ref 5–15)
BILIRUBIN TOTAL: 6.7 mg/dL — AB (ref 0.3–1.2)
BUN: 7 mg/dL (ref 6–20)
BUN: 5 mg/dL — ABNORMAL LOW (ref 6–20)
CALCIUM: 8.2 mg/dL — AB (ref 8.9–10.3)
CO2: 23 mmol/L (ref 22–32)
CO2: 23 mmol/L (ref 22–32)
CREATININE: 0.35 mg/dL — AB (ref 0.44–1.00)
Calcium: 8 mg/dL — ABNORMAL LOW (ref 8.9–10.3)
Chloride: 108 mmol/L (ref 101–111)
Chloride: 106 mmol/L (ref 101–111)
Creatinine, Ser: 0.38 mg/dL — ABNORMAL LOW (ref 0.44–1.00)
GFR calc Af Amer: >60 mL/min (ref >60)
Glucose, Bld: 112 mg/dL — ABNORMAL HIGH (ref 65–99)
Glucose, Bld: 100 mg/dL — ABNORMAL HIGH (ref 65–99)
POTASSIUM: 3.9 mmol/L (ref 3.5–5.1)
Potassium: 4 mmol/L (ref 3.5–5.1)
Sodium: 138 mmol/L (ref 135–145)
Sodium: 136 mmol/L (ref 135–145)
TOTAL PROTEIN: 6.3 g/dL — AB (ref 6.5–8.1)
Total Bilirubin: 4.3 mg/dL — ABNORMAL HIGH (ref 0.3–1.2)
Total Protein: 6.5 g/dL (ref 6.5–8.1)

## 2015-10-31 LAB — CBC
HEMATOCRIT: 43.3 % (ref 35.0–47.0)
HEMATOCRIT: 43.2 % (ref 35.0–47.0)
HEMOGLOBIN: 13.7 g/dL (ref 12.0–16.0)
Hemoglobin: 13.9 g/dL (ref 12.0–16.0)
MCH: 24.3 pg — AB (ref 26.0–34.0)
MCH: 24.6 pg — ABNORMAL LOW (ref 26.0–34.0)
MCHC: 31.7 g/dL — AB (ref 32.0–36.0)
MCHC: 32.1 g/dL (ref 32.0–36.0)
MCV: 76.8 fL — AB (ref 80.0–100.0)
MCV: 76.4 fL — AB (ref 80.0–100.0)
Platelets: 248 10*3/uL (ref 150–440)
Platelets: 244 10*3/uL (ref 150–440)
RBC: 5.63 MIL/uL — ABNORMAL HIGH (ref 3.80–5.20)
RBC: 5.66 MIL/uL — ABNORMAL HIGH (ref 3.80–5.20)
RDW: 19.5 % — ABNORMAL HIGH (ref 11.5–14.5)
RDW: 19.6 % — AB (ref 11.5–14.5)
WBC: 15.5 10*3/uL — ABNORMAL HIGH (ref 3.6–11.0)
WBC: 16.8 10*3/uL — ABNORMAL HIGH (ref 3.6–11.0)

## 2015-10-31 LAB — LIPID PANEL
CHOL/HDL RATIO: 3.3 ratio
CHOLESTEROL: 177 mg/dL (ref 0–200)
HDL: 53 mg/dL (ref >40)
LDL Cholesterol: 93 mg/dL (ref 0–99)
TRIGLYCERIDES: 154 mg/dL — AB (ref <150)
VLDL: 31 mg/dL (ref 0–40)

## 2015-10-31 LAB — LIPASE, BLOOD: Lipase: 1259 U/L — ABNORMAL HIGH (ref 11–51)

## 2015-10-31 MED ORDER — HYDROMORPHONE HCL 1 MG/ML IJ SOLN
1.0000 mg | INTRAMUSCULAR | Status: DC | PRN
  Administered 2015-10-31 – 2015-11-06 (×38): 1 mg via INTRAVENOUS
  Filled 2015-10-31 (×39): qty 1

## 2015-10-31 MED ORDER — METRONIDAZOLE IN NACL 5-0.79 MG/ML-% IV SOLN
500.0000 mg | Freq: Four times a day (QID) | INTRAVENOUS | Status: DC
  Administered 2015-10-31 – 2015-11-05 (×20): 500 mg via INTRAVENOUS
  Filled 2015-10-31 (×24): qty 100

## 2015-10-31 MED ORDER — GADOBENATE DIMEGLUMINE 529 MG/ML IV SOLN
15.0000 mL | Freq: Once | INTRAVENOUS | Status: AC | PRN
  Administered 2015-10-31: 15 mL via INTRAVENOUS

## 2015-10-31 MED ORDER — IOHEXOL 300 MG/ML  SOLN
100.0000 mL | Freq: Once | INTRAMUSCULAR | Status: AC | PRN
Start: 2015-10-31 — End: 2015-10-31
  Administered 2015-10-31: 100 mL via INTRAVENOUS

## 2015-10-31 NOTE — Progress Notes (Signed)
CC: Biliary pancreatitis Subjective: Is a patient with biliary pancreatitis which has not improved she has a CAT scan ordered today. She has pain and nausea  Objective: Vital signs in last 24 hours: Temp:  [98 F (36.7 C)-98.2 F (36.8 C)] 98 F (36.7 C) (12/10 0501) Pulse Rate:  [53-91] 85 (12/10 0501) Resp:  [12-24] 18 (12/10 0501) BP: (114-146)/(65-114) 117/65 mmHg (12/10 0501) SpO2:  [98 %-100 %] 99 % (12/10 0501) Weight:  [179 lb (81.194 kg)-198 lb (89.812 kg)] 179 lb (81.194 kg) (12/09 1604) Last BM Date: 10/30/15  Intake/Output from previous day: 12/09 0701 - 12/10 0700 In: 208.3 [I.V.:208.3] Out: 400 [Urine:400] Intake/Output this shift: Total I/O In: -  Out: 300 [Urine:300]  Physical exam:  No scleral icterus abdomen is soft but very tender in the epigastrium with some guarding and tenderness in both right and left upper quadrants. Calves are nontender integument shows no jaundice  Lab Results: CBC   Recent Labs  10/30/15 1108 10/31/15 0453  WBC 8.7 15.5*  HGB 12.4 13.7  HCT 38.9 43.3  PLT 250 248   BMET  Recent Labs  10/30/15 1108 10/31/15 0453  NA 138 138  K 4.5 4.0  CL 105 108  CO2 24 23  GLUCOSE 181* 112*  BUN 7 7  CREATININE 0.41* 0.35*  CALCIUM 9.7 8.2*   PT/INR No results for input(s): LABPROT, INR in the last 72 hours. ABG No results for input(s): PHART, HCO3 in the last 72 hours.  Invalid input(s): PCO2, PO2  Studies/Results: [place] Abdomen Limited Ruq  10/30/2015  CLINICAL DATA:  Right upper quadrant abdominal pain. EXAM: US ABDOMEN LIMITED - RIGHT UPPER QUADRANT COMPARISON:  10/06/2014 FINDINGS: Gallbladder: Multiple stones present in the gallbladder. These are small stones measuring 4 mm and less. Gallbladder wall thickness is borderline at 2.4 mm. No Murphy sign. Common bile duct: Diameter: 5-6 mm in diameter. No visible ductal stone. In a person of this age, this does raise some possibility of choledocholithiasis. Liver: No focal  lesion identified. Within normal limits in parenchymal echogenicity. IMPRESSION: Chololithiasis. Borderline thick gallbladder wall. No sonographic Murphy's sign. Slight prominence of the common duct for a person of this age. No visible common duct stone, but the finding does raise the possibility of choledocholithiasis. Electronically Signed   By: Paulina FusiMark  Shogry M.D.   On: 10/30/2015 12:11    Anti-infectives: Anti-infectives    Start     Dose/Rate Route Frequency Ordered Stop   10/30/15 1515  cefTRIAXone (ROCEPHIN) 1 g in dextrose 5 % 50 mL IVPB     1 g 100 mL/hr over 30 Minutes Intravenous Every 24 hours 10/30/15 1500        Assessment/Plan:  This a patient with biliary pancreatitis for CT scan today LFTs remain elevated as is her lipase level. My concern is that she has a stone present in the bile duct and will require urgent ERCP GI consultation should be obtained today and hopefully stone extraction can be obtained Lattie Hawichard E Cooper, MD, FACS  10/31/2015

## 2015-10-31 NOTE — Progress Notes (Signed)
[facility] - [place] [facility]   PATIENT NAME: Mallory Shields    MR#:  [id number]  DATE OF BIRTH:  [birth year]  SUBJECTIVE:  CHIEF COMPLAINT:   Chief Complaint  Patient presents with  . Abdominal Pain    Pain is still same.  REVIEW OF SYSTEMS:   CONSTITUTIONAL: No fever, but has poor oral intake and generalized weakness.  EYES: No blurred or double vision.  EARS, NOSE, AND THROAT: No tinnitus or ear pain.  RESPIRATORY: No cough, shortness of breath, wheezing or hemoptysis.  CARDIOVASCULAR: No chest pain, orthopnea, edema.  GASTROINTESTINAL: Has nausea, vomiting, diarrhea and abdominal pain.  GENITOURINARY: No dysuria, hematuria.  ENDOCRINE: No polyuria, nocturia,  HEMATOLOGY: No anemia, easy bruising or bleeding SKIN: No rash or lesion. MUSCULOSKELETAL: No joint pain or arthritis.  NEUROLOGIC: No tingling, numbness, weakness.  PSYCHIATRY: No anxiety or depression.  ROS  DRUG ALLERGIES:  No Known Allergies  VITALS:  Blood pressure 117/73, pulse 117, temperature 98.2 F (36.8 C), temperature source Oral, resp. rate 16, height  (1.626 m), weight 81.194 kg (179 lb), SpO2 97 %, currently breastfeeding.  PHYSICAL EXAMINATION:   GENERAL: 19 y.o.-year-old patient lying in the bed with no acute distress. Obese. EYES: Pupils equal, round, reactive to light and accommodation. No scleral icterus. Extraocular muscles intact.  HEENT: Head atraumatic, normocephalic. Oropharynx and nasopharynx clear.  NECK: Supple, no jugular venous distention. No thyroid enlargement, no tenderness.  LUNGS: Normal breath sounds bilaterally, no wheezing, rales,rhonchi or crepitation. No use of accessory muscles of respiration.  CARDIOVASCULAR: S1, S2 normal. No murmurs, rubs, or gallops.  ABDOMEN: Soft, tender, epigastric tenderness. Bowel sounds present. No organomegaly or mass. Negative Murphy's sign. EXTREMITIES: No pedal edema, cyanosis, or  clubbing.  NEUROLOGIC: Cranial nerves II through XII are intact. Muscle strength 5/5 in all extremities. Sensation intact. Gait not checked.  PSYCHIATRIC: The patient is alert and oriented x 3.  SKIN: No obvious rash, lesion, or ulcer.  Physical Exam LABORATORY PANEL:   CBC  Recent Labs Lab 10/31/15 1320  WBC 16.8*  HGB 13.9  HCT 43.2  PLT 244   ------------------------------------------------------------------------------------------------------------------  Chemistries   Recent Labs Lab 10/30/15 1108  10/31/15 1320  NA 138  < > 136  K 4.5  < > 3.9  CL 105  < > 106  CO2 24  < > 23  GLUCOSE 181*  < > 100*  BUN 7  < > 5*  CREATININE 0.41*  < > 0.38*  CALCIUM 9.7  < > 8.0*  MG 1.8  --   --   AST 789*  < > 231*  ALT 633*  < > 432*  ALKPHOS 170*  < > 162*  BILITOT 4.0*  < > 6.7*  < > = values in this interval not displayed. ------------------------------------------------------------------------------------------------------------------  Cardiac Enzymes No results for input(s): TROPONINI in the last 168 hours. ------------------------------------------------------------------------------------------------------------------  RADIOLOGY:  Ct Abdomen Pelvis W Contrast  10/31/2015  CLINICAL DATA:  Abdominal pain and nausea for 2 days. Vomiting. Epigastric pain. EXAM: CT ABDOMEN AND PELVIS WITH CONTRAST TECHNIQUE: Multidetector CT imaging of the abdomen and pelvis was performed using the standard protocol following bolus administration of intravenous contrast. CONTRAST:  OMNIPAQUE IOHEXOL 300 MG/ML  SOLN COMPARISON:  None available FINDINGS: Lower chest: Mild basilar atelectasis greater on the LEFT. No pericardial fluid. Hepatobiliary: No focal hepatic lesion. There is mild the fluid along the gallbladder fossa and Morrison's pouch to the liver. No biliary duct  dilatation. The common bile duct is normal caliber. Pancreas: There is edema of the pancreas. The pancreas  appears to enhancement throughout the head body and tail. One small focus of nonenhancement centrally within the pancreas on image 32, series 2. There is a moderate volume of free fluid along the anterior perirenal space the LEFT and RIGHT. There is fluid within the lesser omentum. This combination of findings is suggestive of acute pancreatitis. No organized fluid collections at this time point. Spleen: Normal spleen Adrenals/urinary tract: Adrenal glands and kidneys are normal. The ureters and bladder normal. Stomach/Bowel: Stomach, small bowel, appendix, and cecum are normal. There is some inflammation of the the transverse colon likely secondary the from the acute pancreatitis. No evidence of bowel perforation. Distal colon rectum are normal. Vascular/Lymphatic: Abdominal aorta is normal caliber. There is no retroperitoneal or periportal lymphadenopathy. No pelvic lymphadenopathy. No evidence of vascular complication from pancreatitis. The branches aorta normal. Reproductive: Uterus is normal. Moderate free fluid. Ovaries normal. Other: Bladder free fluid the pelvis. Musculoskeletal: No aggressive osseous lesion. IMPRESSION: 1. Findings most consists with acute pancreatitis. The pancreas is edematous with extensive free fluid collections along the LEFT RIGHT anterior para renal fascia. No organized collections. 2. Pancreas enhances uniformly.  No vascular complication 3. Fluid in the Lesser omentum with secondary inflammation of the transverse colon. 4. Fluid in the pelvis also related to pancreatitis. Electronically Signed   By: Genevive Bi M.D.   On: 10/31/2015 09:15   Mr Roe Coombs W/wo Cm/mrcp  10/31/2015  CLINICAL DATA:  Abdominal pain, nausea, and vomiting for 2 days. Diarrhea. Elevated lipase. Pancreatitis. EXAM: MRI ABDOMEN WITHOUT AND WITH CONTRAST (INCLUDING MRCP) TECHNIQUE: Multiplanar multisequence MR imaging of the abdomen was performed both before and after the administration of intravenous  contrast. Heavily T2-weighted images of the biliary and pancreatic ducts were obtained, and three-dimensional MRCP images were rendered by post processing. CONTRAST:  15mL MULTIHANCE GADOBENATE DIMEGLUMINE 529 MG/ML IV SOLN COMPARISON:  CT scan from 10/31/2015 FINDINGS: Lower chest:  Small left and trace right pleural effusions. Hepatobiliary: No focal hepatic lesion or significant intrahepatic biliary dilatation. Common bile duct measures up to 10 mm diameter. There numerous tiny gallstones loose in the neck of the gallbladder, as shown on images 13 through 19 of series 6. The possibility of a tiny stone wedged in the ampulla is raised on image 23 of series 6 although the finding is highly subtle. Minimal biliary wall enhancement, not highly characteristic for over cholangitis at this time. There is some fluid adjacent to the gallbladder but this may be simply related to the diffuse ascites. The gallbladder wall is not appreciably thickened. Pancreas: Diffusely edematous pancreas compatible with acute pancreatitis. Corresponding to the lucencies on CT there is some faint areas of hypo enhancement which may represent dilated side ducts. No discrete abscess, and the appearance is not characteristic for pancreatic necrosis. The degree of peripancreatic stranding is striking. Dorsal pancreatic duct is not overtly dilated. No pseudocyst. Acute peripancreatic fluid collections are present but there is also generalized ascites and mesenteric edema in the upper abdomen. Spleen: Unremarkable.  Splenic vein patent. Adrenals/Urinary Tract: Unremarkable where visualized. Stomach/Bowel: No dilated upper abdominal bowel. Vascular/Lymphatic: Unremarkable Other: Diffuse mesenteric edema and ascites. Musculoskeletal: Unremarkable IMPRESSION: 1. Dilated common bile duct, with numerous tiny gallstones layering dependently in the neck of the gallbladder. Subtle suggestion of a tiny gallstone in the ampulla on a single image, although  the finding is indeed subtle. 2. Extensive pancreatitis with acute peripancreatic  fluid collections, mesenteric edema, and considerable ascites. Small left and trace right pleural effusions. 3. Several small foci of hypoenhancement in the pancreas, likely from accumulated fluid and irregular dilated side ducts, less likely to be from pancreatic necrosis or early abscess formation, although a low threshold for reimaging is suggested given the general severity of the pancreatitis. Electronically Signed   By: Gaylyn RongWalter  Liebkemann M.D.   On: 10/31/2015 17:01   [place] Abdomen Limited Ruq  10/30/2015  CLINICAL DATA:  Right upper quadrant abdominal pain. EXAM: US ABDOMEN LIMITED - RIGHT UPPER QUADRANT COMPARISON:  10/06/2014 FINDINGS: Gallbladder: Multiple stones present in the gallbladder. These are small stones measuring 4 mm and less. Gallbladder wall thickness is borderline at 2.4 mm. No Murphy sign. Common bile duct: Diameter: 5-6 mm in diameter. No visible ductal stone. In a person of this age, this does raise some possibility of choledocholithiasis. Liver: No focal lesion identified. Within normal limits in parenchymal echogenicity. IMPRESSION: Chololithiasis. Borderline thick gallbladder wall. No sonographic Murphy's sign. Slight prominence of the common duct for a person of this age. No visible common duct stone, but the finding does raise the possibility of choledocholithiasis. Electronically Signed   By: Paulina FusiMark  Shogry M.D.   On: 10/30/2015 12:11    ASSESSMENT AND PLAN:   Principal Problem:   Acute pancreatitis Active Problems:   Gallstone   Pancreatitis, acute   Right upper quadrant abdominal pain   Pancreatitis, gallstone   * acute pancreatitis   Likely bile duct stone,   LFTs slightly better   But overall pt clinically appears worrisome.   Appreciated help of surgery and GI services.     Dr. Shelle Ironein [facility] and they suggested non intervention- no need for ERCP at this  time.    Check for Lipid panel.    Cont pain meds and IV fluids.   All the records are reviewed and case discussed with Care Management/Social Workerr. Management plans discussed with the patient, family and they are in agreement.  CODE STATUS: full.  TOTAL TIME TAKING CARE OF THIS PATIENT: 35 minutes.    POSSIBLE D/C IN 2-3 DAYS, DEPENDING ON CLINICAL CONDITION.   Altamese DillingVACHHANI, Vinson Tietze M.D on 10/31/2015   Between 7am to 6pm - Pager - [phone]  After 6pm go to [email or web address] - password [email or web address] [facility]  [facility]  Office  [phone]  CC: Primary care physician; No primary care provider on file.  Note: This dictation was prepared with Dragon dictation along with smaller phrase technology. Any transcriptional errors that result from this process are unintentional.

## 2015-10-31 NOTE — Progress Notes (Signed)
Second bottle of contrast given to pt.

## 2015-10-31 NOTE — Progress Notes (Signed)
Pt. Finished first bottle of contrast with out c/o nausea or vomiting. Will continue to monitor pt.

## 2015-10-31 NOTE — Progress Notes (Signed)
Initial Nutrition Assessment    INTERVENTION:   Coordination of Care: diet progression as medically able   NUTRITION DIAGNOSIS:   Inadequate oral intake related to acute illness, poor appetite as evidenced by NPO status, per patient/family report.  GOAL:   Patient will meet greater than or equal to 90% of their needs  MONITOR:    (Energy Intake, Digestive System, Anthropometrics, Electrolyte/Renal Profile)  REASON FOR ASSESSMENT:   Diagnosis, Malnutrition Screening Tool    ASSESSMENT:    Pt admitted with acute biliary pancreatitis, repeat CT today, possible GI consult with ERCP. Pt is >1 month postpartum  History reviewed. No pertinent past medical history.   Diet Order:  Diet NPO time specified Except for: Sips with Meds   Digestive System: +abdominal pain and nausea  Skin:  Reviewed, no issues  Last BM:  12/9   Electrolyte and Renal Profile:  Recent Labs Lab 10/30/15 1108 10/31/15 0453  BUN 7 7  CREATININE 0.41* 0.35*  NA 138 138  K 4.5 4.0  MG 1.8  --    Glucose Profile: No results for input(s): GLUCAP in the last 72 hours. Nutritional Anemia Profile:  CBC Latest Ref Rng 10/31/2015 10/30/2015 09/18/2015  WBC 3.6 - 11.0 K/uL 15.5(H) 8.7 12.7(H)  Hemoglobin 12.0 - 16.0 g/dL 16.113.7 09.612.4 10.9(L)  Hematocrit 35.0 - 47.0 % 43.3 38.9 33.2(L)  Platelets 150 - 440 K/uL 248 250 304    Protein Profile:   Recent Labs Lab 10/30/15 1108 10/31/15 0453  ALBUMIN 4.6 3.5   Lipase     Component Value Date/Time   LIPASE 1259* 10/31/2015 0453    Meds: NS at 125 ml/hr  Nutrition Focused Physical Exam:  Unable to complete Nutrition-Focused physical exam at this time.    Height:   Ht Readings from Last 1 Encounters:  10/30/15 5\' 4"  (1.626 m)    Weight: pt with wt loss since October but pt postpartum >1 month, some wt loss expected  Wt Readings from Last 1 Encounters:  10/30/15 179 lb (81.194 kg)    Wt Readings from Last 10 Encounters:  10/30/15  179 lb (81.194 kg)  09/20/15 200 lb (90.719 kg)  09/18/15 200 lb (90.719 kg)    BMI:  Body mass index is 30.71 kg/(m^2).  Estimated Nutritional Needs:   Kcal:  [id number] kcals (BEE 1305, 1.3 AF, 1.0-1.2 IF)   Protein:  55-66 g (1.0-1.2 g/kg)   Fluid:  1650-1925 mL (30-35 ml/kg)      MODERATE Care Level   Romelle Starcherate Ernestine Rohman MS, RD, LDN [phone] Pager

## 2015-10-31 NOTE — Progress Notes (Signed)
GI Note:  Full Note to follow.  We do not have ERCP coverage this weekend.  Will obtain stat MRCP and repeat met-c and cbc now.   If she develops overt cholangitis will require urgent transfer for ERCP.

## 2015-10-31 NOTE — Progress Notes (Signed)
GI Note:  I spoke to [facility] GI [facility] regarding urgent ER[facility] today since we do not have ERCP coverage.  They would not do ERCP at all unless she had overt evidence of severe cholangitis, esp given the severe pancreatitis changes on CT and the lack of dilation of the common bile duct.  If ERCP were to become urgent in this manner, they would be happy to accept transfer.    For now, will add flagyl to the ceftriaxone, obtain MRCP, aggressively fluid hydrate and control pain.   Discussed with hospitalist. .

## 2015-10-31 NOTE — Progress Notes (Signed)
GI Inpatient Consult Note  Reason for Consult: gallstone pancreatitis   Attending Requesting Consult: Boyce Medici  History of Present Illness: Adena Sima is a 19 y.o. female with no significant PMHx presenting for 2 days of severe epigastric pain and n/v.  No similar presentations. No dietary or medication triggers.  Presented to ED and was found to have lipase in 3,000s  ast and alt in 500 and 600's.  CT obtained showed changes of acute pancreatitis with normal caliber bile duct.  U/s showed gallstones, mildly dilated CBD.  Surg and GI consults obtained.   She is being treated with pain meds, IV hydration, abx, npo status.  Still having a lot of epigastric pain but better than day of presentation.  Some n but no vomiting today.  No c/p, SOB, rectal bleeding, melena.  ASt and ALT, lipase trending own on day 2, t.bili trending upward.  Renal function still preserved.  No fever or chills or hypotension.  Has developed a leukocytosis since presentation.    Past Medical History:  History reviewed. No pertinent past medical history.  Problem List: Patient Active Problem List   Diagnosis Date Noted  . Pancreatitis, gallstone   . Acute pancreatitis 10/30/2015  . Gallstone 10/30/2015  . Pancreatitis, acute   . Right upper quadrant abdominal pain   . Postpartum care following vaginal delivery 09/18/2015    Past Surgical History: History reviewed. No pertinent past surgical history.  Allergies: No Known Allergies  Home Medications: Prescriptions prior to admission  Medication Sig Dispense Refill Last Dose  . benzocaine-Menthol (DERMOPLAST) 20-0.5 % AERO Apply 1 application topically as needed for irritation (perineal discomfort). (Patient not taking: Reported on 10/30/2015)     . ibuprofen (ADVIL,MOTRIN) 600 MG tablet Take 1 tablet (600 mg total) by mouth every 6 (six) hours. (Patient not taking: Reported on 10/30/2015) 30 tablet 0   . lanolin OINT Apply 1 application topically as needed  (for breast care). (Patient not taking: Reported on 10/30/2015)  0   . Prenatal Vit-Fe Fumarate-FA (PRENATAL MULTIVITAMIN) TABS tablet Take 1 tablet by mouth daily at 12 noon. (Patient not taking: Reported on 10/30/2015)      Home medication reconciliation was completed with the patient.   Scheduled Inpatient Medications:   . cefTRIAXone (ROCEPHIN)  IV  1 g Intravenous Q24H  . heparin  5,000 Units Subcutaneous 3 times per day  . metronidazole  500 mg Intravenous Q6H    Continuous Inpatient Infusions:   . 0.9 % NaCl with KCl 20 mEq / L 125 mL/hr at 10/31/15 0814    PRN Inpatient Medications:  acetaminophen **OR** acetaminophen, albuterol, HYDROmorphone (DILAUDID) injection, iohexol, ondansetron **OR** ondansetron (ZOFRAN) IV, oxyCODONE  Family History: family history includes Gallstones in her father, mother, and sister. There is no history of Miscarriages / [place].   Social History:   reports that she has never smoked. She has never used smokeless tobacco. She reports that she does not drink alcohol or use illicit drugs.   Review of Systems: Constitutional: Weight is stable.  Eyes: No changes in vision. ENT: No oral lesions, sore throat.  GI: see HPI.  Heme/Lymph: No easy bruising.  CV: No chest pain.  GU: No hematuria.  Integumentary: No rashes.  Neuro: No headaches.  Psych: No depression/anxiety.  Endocrine: No heat/cold intolerance.  Allergic/Immunologic: No urticaria.  Resp: No cough, SOB.  Musculoskeletal: No joint swelling.    Physical Examination: BP 122/78 mmHg  Pulse 91  Temp(Src) 98.3 F (36.8 C) (Oral)  Resp 16  Ht _0  (1.626 m)  Wt 81.194 kg (179 lb)  BMI 30.71 kg/m2  SpO2 97%  LMP   Breastfeeding? Yes Gen: NAD, alert and oriented x 4 HEENT: PEERLA, EOMI, Neck: supple, no JVD or thyromegaly Chest: CTA bilaterally, no wheezes, crackles, or other adventitious sounds CV: RRR, no m/g/c/r Abd: + diffuse significant TTP, worse in epigastrum, no  r/g, nabs Ext: no edema, well perfused with 2+ pulses, Skin: no rash or lesions noted Lymph: no LAD  Data: Lab Results  Component Value Date   WBC 16.8* 10/31/2015   HGB 13.9 10/31/2015   HCT 43.2 10/31/2015   MCV 76.4* 10/31/2015   PLT 244 10/31/2015    Recent Labs Lab 10/30/15 1108 10/31/15 0453 10/31/15 1320  HGB 12.4 13.7 13.9   Lab Results  Component Value Date   NA 136 10/31/2015   K 3.9 10/31/2015   CL 106 10/31/2015   CO2 23 10/31/2015   BUN 5* 10/31/2015   CREATININE 0.38* 10/31/2015   Lab Results  Component Value Date   ALT 432* 10/31/2015   AST 231* 10/31/2015   ALKPHOS 162* 10/31/2015   BILITOT 6.7* 10/31/2015   No results for input(s): APTT, INR, PTT in the last 168 hours.   Assessment/Plan: Ms. Roshunda Keir is a 19 y.o. female with gallstone pancreatitis.  Ct with extensvie edema and fluid collections with normal caliber bile duct.  Labs slightly imrpoved today but still with significant epigastric pain despite IV pain meds.  I spoke to on call GI at [facility] regarding urgent ERCP today since we do not have ERCP coverage. They would not do ERCP at all unless she had overt evidence of severe cholangitis, esp given the severe pancreatitis changes on CT and the lack of dilation of the common bile duct. If ERCP were to become urgent in this manner, they would be happy to accept transfer.   Recs: -add flagyl to the ceftriaxone -obtain MRCP aggressively fluid hydration with boluses and control pain.  - repeat cbc and met-c now.  - will require urgent transfer for ERCP if develops s/s cholangitis - monitor closely for s/s cholangitis.   - discussed with hospitalist, [facility], surgery.   Recommendations:  Thank you for the consult. Please call with questions or concerns.  [place] Takacs, Grace Blight, MD

## 2015-11-01 LAB — URINALYSIS COMPLETE WITH MICROSCOPIC (ARMC ONLY)
GLUCOSE, UA: NEGATIVE mg/dL
Hgb urine dipstick: NEGATIVE
NITRITE: NEGATIVE
Protein, ur: 30 mg/dL — AB
SPECIFIC GRAVITY, URINE: 1.028 (ref 1.005–1.030)
pH: 6 (ref 5.0–8.0)

## 2015-11-01 LAB — COMPREHENSIVE METABOLIC PANEL
ALK PHOS: 163 U/L — AB (ref 38–126)
ALT: 276 U/L — AB (ref 14–54)
AST: 107 U/L — AB (ref 15–41)
Albumin: 2.9 g/dL — ABNORMAL LOW (ref 3.5–5.0)
Anion gap: 7 (ref 5–15)
BILIRUBIN TOTAL: 4 mg/dL — AB (ref 0.3–1.2)
BUN: 5 mg/dL — AB (ref 6–20)
CO2: 24 mmol/L (ref 22–32)
Calcium: 7.6 mg/dL — ABNORMAL LOW (ref 8.9–10.3)
Chloride: 106 mmol/L (ref 101–111)
Creatinine, Ser: 0.39 mg/dL — ABNORMAL LOW (ref 0.44–1.00)
GFR calc Af Amer: >60 mL/min (ref >60)
Glucose, Bld: 86 mg/dL (ref 65–99)
Potassium: 3.6 mmol/L (ref 3.5–5.1)
Sodium: 137 mmol/L (ref 135–145)
TOTAL PROTEIN: 5.6 g/dL — AB (ref 6.5–8.1)

## 2015-11-01 LAB — CBC
HEMATOCRIT: 38.5 % (ref 35.0–47.0)
Hemoglobin: 12.4 g/dL (ref 12.0–16.0)
MCH: 25.2 pg — ABNORMAL LOW (ref 26.0–34.0)
MCHC: 32.3 g/dL (ref 32.0–36.0)
MCV: 77.9 fL — AB (ref 80.0–100.0)
Platelets: 218 10*3/uL (ref 150–440)
RBC: 4.94 MIL/uL (ref 3.80–5.20)
RDW: 19.9 % — AB (ref 11.5–14.5)
WBC: 17.5 10*3/uL — AB (ref 3.6–11.0)

## 2015-11-01 LAB — LIPASE, BLOOD: Lipase: 872 U/L — ABNORMAL HIGH (ref 11–51)

## 2015-11-01 MED ORDER — CALCIUM GLUCONATE 10 % IV SOLN
2.0000 g | Freq: Once | INTRAVENOUS | Status: AC
  Administered 2015-11-01: 2 g via INTRAVENOUS
  Filled 2015-11-01 (×2): qty 20

## 2015-11-01 NOTE — Progress Notes (Signed)
CC: Biliary pancreatitis Subjective: Patient continues to have pain secondary to biliary pancreatitis. Her pain is in the epigastrium.  No fevers or chills. Dark urine is noted.  Objective: Vital signs in last 24 hours: Temp:  [98 F (36.7 C)-98.3 F (36.8 C)] 98 F (36.7 C) (12/11 0534) Pulse Rate:  [91-125] 125 (12/11 0534) Resp:  [16-20] 20 (12/11 0534) BP: (110-122)/(65-78) 110/65 mmHg (12/11 0534) SpO2:  [96 %-97 %] 96 % (12/11 0534) Last BM Date: 10/30/15  Intake/Output from previous day: 12/10 0701 - 12/11 0700 In: 4068.1 [I.V.:4068.1] Out: 1200 [Urine:1200] Intake/Output this shift: Total I/O In: 177.4 [I.V.:177.4] Out: 0   Physical exam:  Patient remains afebrile but continues to have considerable epigastric pain. Abdomen is distended and tender in the epigastrium right upper quadrant and left upper quadrant. Jaundice is noted Icterus present Nontender calves  Lab Results: CBC   Recent Labs  10/31/15 1320 11/01/15 0425  WBC 16.8* 17.5*  HGB 13.9 12.4  HCT 43.2 38.5  PLT 244 218   BMET  Recent Labs  10/31/15 1320 11/01/15 0425  NA 136 137  K 3.9 3.6  CL 106 106  CO2 23 24  GLUCOSE 100* 86  BUN 5* 5*  CREATININE 0.38* 0.39*  CALCIUM 8.0* 7.6*   PT/INR No results for input(s): LABPROT, INR in the last 72 hours. ABG No results for input(s): PHART, HCO3 in the last 72 hours.  Invalid input(s): PCO2, PO2  Studies/Results: Ct Abdomen Pelvis W Contrast  10/31/2015  CLINICAL DATA:  Abdominal pain and nausea for 2 days. Vomiting. Epigastric pain. EXAM: CT ABDOMEN AND PELVIS WITH CONTRAST TECHNIQUE: Multidetector CT imaging of the abdomen and pelvis was performed using the standard protocol following bolus administration of intravenous contrast. CONTRAST:  100mL OMNIPAQUE IOHEXOL 300 MG/ML  SOLN COMPARISON:  None available FINDINGS: Lower chest: Mild basilar atelectasis greater on the LEFT. No pericardial fluid. Hepatobiliary: No focal hepatic  lesion. There is mild the fluid along the gallbladder fossa and Morrison's pouch to the liver. No biliary duct dilatation. The common bile duct is normal caliber. Pancreas: There is edema of the pancreas. The pancreas appears to enhancement throughout the head body and tail. One small focus of nonenhancement centrally within the pancreas on image 32, series 2. There is a moderate volume of free fluid along the anterior perirenal space the LEFT and RIGHT. There is fluid within the lesser omentum. This combination of findings is suggestive of acute pancreatitis. No organized fluid collections at this time point. Spleen: Normal spleen Adrenals/urinary tract: Adrenal glands and kidneys are normal. The ureters and bladder normal. Stomach/Bowel: Stomach, small bowel, appendix, and cecum are normal. There is some inflammation of the the transverse colon likely secondary the from the acute pancreatitis. No evidence of bowel perforation. Distal colon rectum are normal. Vascular/Lymphatic: Abdominal aorta is normal caliber. There is no retroperitoneal or periportal lymphadenopathy. No pelvic lymphadenopathy. No evidence of vascular complication from pancreatitis. The branches aorta normal. Reproductive: Uterus is normal. Moderate free fluid. Ovaries normal. Other: Bladder free fluid the pelvis. Musculoskeletal: No aggressive osseous lesion. IMPRESSION: 1. Findings most consists with acute pancreatitis. The pancreas is edematous with extensive free fluid collections along the LEFT RIGHT anterior para renal fascia. No organized collections. 2. Pancreas enhances uniformly.  No vascular complication 3. Fluid in the Lesser omentum with secondary inflammation of the transverse colon. 4. Fluid in the pelvis also related to pancreatitis. Electronically Signed   By: Genevive BiStewart  Edmunds M.D.   On: 10/31/2015  09:15   Mr Roe Coombs W/wo Cm/mrcp  10/31/2015  CLINICAL DATA:  Abdominal pain, nausea, and vomiting for 2 days. Diarrhea. Elevated  lipase. Pancreatitis. EXAM: MRI ABDOMEN WITHOUT AND WITH CONTRAST (INCLUDING MRCP) TECHNIQUE: Multiplanar multisequence MR imaging of the abdomen was performed both before and after the administration of intravenous contrast. Heavily T2-weighted images of the biliary and pancreatic ducts were obtained, and three-dimensional MRCP images were rendered by post processing. CONTRAST:  15mL MULTIHANCE GADOBENATE DIMEGLUMINE 529 MG/ML IV SOLN COMPARISON:  CT scan from 10/31/2015 FINDINGS: Lower chest:  Small left and trace right pleural effusions. Hepatobiliary: No focal hepatic lesion or significant intrahepatic biliary dilatation. Common bile duct measures up to 10 mm diameter. There numerous tiny gallstones loose in the neck of the gallbladder, as shown on images 13 through 19 of series 6. The possibility of a tiny stone wedged in the ampulla is raised on image 23 of series 6 although the finding is highly subtle. Minimal biliary wall enhancement, not highly characteristic for over cholangitis at this time. There is some fluid adjacent to the gallbladder but this may be simply related to the diffuse ascites. The gallbladder wall is not appreciably thickened. Pancreas: Diffusely edematous pancreas compatible with acute pancreatitis. Corresponding to the lucencies on CT there is some faint areas of hypo enhancement which may represent dilated side ducts. No discrete abscess, and the appearance is not characteristic for pancreatic necrosis. The degree of peripancreatic stranding is striking. Dorsal pancreatic duct is not overtly dilated. No pseudocyst. Acute peripancreatic fluid collections are present but there is also generalized ascites and mesenteric edema in the upper abdomen. Spleen: Unremarkable.  Splenic vein patent. Adrenals/Urinary Tract: Unremarkable where visualized. Stomach/Bowel: No dilated upper abdominal bowel. Vascular/Lymphatic: Unremarkable Other: Diffuse mesenteric edema and ascites. Musculoskeletal:  Unremarkable IMPRESSION: 1. Dilated common bile duct, with numerous tiny gallstones layering dependently in the neck of the gallbladder. Subtle suggestion of a tiny gallstone in the ampulla on a single image, although the finding is indeed subtle. 2. Extensive pancreatitis with acute peripancreatic fluid collections, mesenteric edema, and considerable ascites. Small left and trace right pleural effusions. 3. Several small foci of hypoenhancement in the pancreas, likely from accumulated fluid and irregular dilated side ducts, less likely to be from pancreatic necrosis or early abscess formation, although a low threshold for reimaging is suggested given the general severity of the pancreatitis. Electronically Signed   By: Gaylyn Rong M.D.   On: 10/31/2015 17:01   US Abdomen Limited Ruq  10/30/2015  CLINICAL DATA:  Right upper quadrant abdominal pain. EXAM: US ABDOMEN LIMITED - RIGHT UPPER QUADRANT COMPARISON:  10/06/2014 FINDINGS: Gallbladder: Multiple stones present in the gallbladder. These are small stones measuring 4 mm and less. Gallbladder wall thickness is borderline at 2.4 mm. No Murphy sign. Common bile duct: Diameter: 5-6 mm in diameter. No visible ductal stone. In a person of this age, this does raise some possibility of choledocholithiasis. Liver: No focal lesion identified. Within normal limits in parenchymal echogenicity. IMPRESSION: Chololithiasis. Borderline thick gallbladder wall. No sonographic Murphy's sign. Slight prominence of the common duct for a person of this age. No visible common duct stone, but the finding does raise the possibility of choledocholithiasis. Electronically Signed   By: Paulina Fusi M.D.   On: 10/30/2015 12:11    Anti-infectives: Anti-infectives    Start     Dose/Rate Route Frequency Ordered Stop   10/31/15 1345  metroNIDAZOLE (FLAGYL) IVPB 500 mg     500 mg 100 mL/hr over  60 Minutes Intravenous Every 6 hours 10/31/15 1341     10/30/15 1515  cefTRIAXone  (ROCEPHIN) 1 g in dextrose 5 % 50 mL IVPB     1 g 100 mL/hr over 30 Minutes Intravenous Every 24 hours 10/30/15 1500        Assessment/Plan:  Labs are personally reviewed. Biliary pancreatitis. Dr. Shelle Iron and I did discuss yesterday for transfer for ERCP Dr. Shelle Iron was unable to find a accepting physician. We'll continue to monitor. Her calcium corrected is still normal. Will ultimately require laparoscopic cholecystectomy but only to prevent this from occurring again. Mediate treatment is supportive and stone extraction with papillotomy if pain continues. It is no I physician who does ERCPs available this weekend apparently and Dr. Shelle Iron was unable to arrange for transfer.  Lattie Haw, MD, FACS  11/01/2015

## 2015-11-01 NOTE — Progress Notes (Signed)
GI Inpatient Follow-up Note  Patient Identification: Walden FieldSandy Alejo Jimenez is a 19 y.o. female severe gallstone panc  Subjective:  Abd pain better today but still 7/10.  Comes in waves with sharp pain.  Nause improved. No f/c.  No rectal bleeding or melena.   Scheduled Inpatient Medications:  . cefTRIAXone (ROCEPHIN)  IV  1 g Intravenous Q24H  . heparin  5,000 Units Subcutaneous 3 times per day  . metronidazole  500 mg Intravenous Q6H    Continuous Inpatient Infusions:   . 0.9 % NaCl with KCl 20 mEq / L 125 mL/hr at 11/01/15 1547    PRN Inpatient Medications:  acetaminophen **OR** acetaminophen, albuterol, HYDROmorphone (DILAUDID) injection, iohexol, ondansetron **OR** ondansetron (ZOFRAN) IV, oxyCODONE  Review of Systems: Constitutional: Weight is stable.  Eyes: No changes in vision. ENT: No oral lesions, sore throat.  GI: see HPI.  Heme/Lymph: No easy bruising.  CV: No chest pain.  GU: No hematuria.  Integumentary: No rashes.  Neuro: No headaches.  Psych: No depression/anxiety.  Endocrine: No heat/cold intolerance.  Allergic/Immunologic: No urticaria.  Resp: No cough, SOB.  Musculoskeletal: No joint swelling.    Physical Examination: BP 105/57 mmHg  Pulse 142  Temp(Src) 99.1 F (37.3 C) (Oral)  Resp 16  Ht 5\' 4"  (1.626 m)  Wt 81.194 kg (179 lb)  BMI 30.71 kg/m2  SpO2 91%  LMP   Breastfeeding? Yes Gen: NAD, alert and oriented x 4 HEENT: PEERLA, EOMI, Neck: supple, no JVD or thyromegaly Chest: CTA bilaterally, no wheezes, crackles, or other adventitious sounds CV: RRR, no m/g/c/r Abd: + very significant TTP over entire abd, worse in epigastric region.  Ext: no edema, well perfused with 2+ pulses, Skin: no rash or lesions noted Lymph: no LAD  Data: Lab Results  Component Value Date   WBC 17.5* 11/01/2015   HGB 12.4 11/01/2015   HCT 38.5 11/01/2015   MCV 77.9* 11/01/2015   PLT 218 11/01/2015    Recent Labs Lab 10/31/15 0453 10/31/15 1320  11/01/15 0425  HGB 13.7 13.9 12.4   Lab Results  Component Value Date   NA 137 11/01/2015   K 3.6 11/01/2015   CL 106 11/01/2015   CO2 24 11/01/2015   BUN 5* 11/01/2015   CREATININE 0.39* 11/01/2015   Lab Results  Component Value Date   ALT 276* 11/01/2015   AST 107* 11/01/2015   ALKPHOS 163* 11/01/2015   BILITOT 4.0* 11/01/2015   No results for input(s): APTT, INR, PTT in the last 168 hours.   Assessment/Plan: Ms. Nadyne Coombeslejo Jimenez is a 19 y.o. female with severe gallstone pancreatitis.  MRCP shows CBD to 10 mm and possible tiny stone in ampulla.   Liver enzymes much better today but she continues to have severe abd pain.  Unclear how much from just severe pancreatitis vs contribution from possible tiny stone in ampulla.   Recommendations: - cont ceftr and flagyl - cont aggressive IV fluid, pain control - will consider ERCP tomorrow or Tues if pain does not improve and liver enzymes do not continue to improve.   Please call with questions or concerns.  Dailee Manalang, Addison NaegeliMATTHEW GORDON, MD

## 2015-11-01 NOTE — Progress Notes (Signed)
[facility] [place] [facility]   PATIENT NAME: Mallory Shields    MR#:  [id number]  DATE OF BIRTH:  06/12/1996  SUBJECTIVE:  CHIEF COMPLAINT:   Chief Complaint  Patient presents with  . Abdominal Pain    Pain is much better today, some nausea, no vomit. Did not have any BM in last 2-3 days.  REVIEW OF SYSTEMS:   CONSTITUTIONAL: No fever, but has poor oral intake and generalized weakness.  EYES: No blurred or double vision.  EARS, NOSE, AND THROAT: No tinnitus or ear pain.  RESPIRATORY: No cough, shortness of breath, wheezing or hemoptysis.  CARDIOVASCULAR: No chest pain, orthopnea, edema.  GASTROINTESTINAL: Has nausea, no vomiting, diarrhea and have mild abdominal pain.  GENITOURINARY: No dysuria, hematuria. C/o dark urine. ENDOCRINE: No polyuria, nocturia,  HEMATOLOGY: No anemia, easy bruising or bleeding SKIN: No rash or lesion. MUSCULOSKELETAL: No joint pain or arthritis.  NEUROLOGIC: No tingling, numbness, weakness.  PSYCHIATRY: No anxiety or depression.  ROS  DRUG ALLERGIES:  No Known Allergies  VITALS:  Blood pressure 105/57, pulse 142, temperature 99.1 F (37.3 C), temperature source Oral, resp. rate 16, height 5\' 4"  (1.626 m), weight 81.194 kg (179 lb), SpO2 91 %, currently breastfeeding.  PHYSICAL EXAMINATION:   GENERAL: 19 y.o.-year-old patient lying in the bed with no acute distress. Obese. EYES: Pupils equal, round, reactive to light and accommodation. No scleral icterus. Extraocular muscles intact.  HEENT: Head atraumatic, normocephalic. Oropharynx and nasopharynx clear.  NECK: Supple, no jugular venous distention. No thyroid enlargement, no tenderness.  LUNGS: Normal breath sounds bilaterally, no wheezing, rales,rhonchi or crepitation. No use of accessory muscles of respiration.  CARDIOVASCULAR: S1, S2 normal. No murmurs, rubs, or gallops.  ABDOMEN: Soft, tender, epigastric tenderness. Bowel sounds present.  No organomegaly or mass. Negative Murphy's sign. EXTREMITIES: No pedal edema, cyanosis, or clubbing.  NEUROLOGIC: Cranial nerves II through XII are intact. Muscle strength 5/5 in all extremities. Sensation intact. Gait not checked.  PSYCHIATRIC: The patient is alert and oriented x 3.  SKIN: No obvious rash, lesion, or ulcer.  Physical Exam LABORATORY PANEL:   CBC  Recent Labs Lab 11/01/15 0425  WBC 17.5*  HGB 12.4  HCT 38.5  PLT 218   ------------------------------------------------------------------------------------------------------------------  Chemistries   Recent Labs Lab 10/30/15 1108  11/01/15 0425  NA 138  < > 137  K 4.5  < > 3.6  CL 105  < > 106  CO2 24  < > 24  GLUCOSE 181*  < > 86  BUN 7  < > 5*  CREATININE 0.41*  < > 0.39*  CALCIUM 9.7  < > 7.6*  MG 1.8  --   --   AST 789*  < > 107*  ALT 633*  < > 276*  ALKPHOS 170*  < > 163*  BILITOT 4.0*  < > 4.0*  < > = values in this interval not displayed. ------------------------------------------------------------------------------------------------------------------  Cardiac Enzymes No results for input(s): TROPONINI in the last 168 hours. ------------------------------------------------------------------------------------------------------------------  RADIOLOGY:  Ct Abdomen Pelvis W Contrast  10/31/2015  CLINICAL DATA:  Abdominal pain and nausea for 2 days. Vomiting. Epigastric pain. EXAM: CT ABDOMEN AND PELVIS WITH CONTRAST TECHNIQUE: Multidetector CT imaging of the abdomen and pelvis was performed using the standard protocol following bolus administration of intravenous contrast. CONTRAST:  100mL OMNIPAQUE IOHEXOL 300 MG/ML  SOLN COMPARISON:  None available FINDINGS: Lower chest: Mild basilar atelectasis greater on the LEFT. No pericardial fluid. Hepatobiliary: No focal hepatic  lesion. There is mild the fluid along the gallbladder fossa and Morrison's pouch to the liver. No biliary duct dilatation. The  common bile duct is normal caliber. Pancreas: There is edema of the pancreas. The pancreas appears to enhancement throughout the head body and tail. One small focus of nonenhancement centrally within the pancreas on image 32, series 2. There is a moderate volume of free fluid along the anterior perirenal space the LEFT and RIGHT. There is fluid within the lesser omentum. This combination of findings is suggestive of acute pancreatitis. No organized fluid collections at this time point. Spleen: Normal spleen Adrenals/urinary tract: Adrenal glands and kidneys are normal. The ureters and bladder normal. Stomach/Bowel: Stomach, small bowel, appendix, and cecum are normal. There is some inflammation of the the transverse colon likely secondary the from the acute pancreatitis. No evidence of bowel perforation. Distal colon rectum are normal. Vascular/Lymphatic: Abdominal aorta is normal caliber. There is no retroperitoneal or periportal lymphadenopathy. No pelvic lymphadenopathy. No evidence of vascular complication from pancreatitis. The branches aorta normal. Reproductive: Uterus is normal. Moderate free fluid. Ovaries normal. Other: Bladder free fluid the pelvis. Musculoskeletal: No aggressive osseous lesion. IMPRESSION: 1. Findings most consists with acute pancreatitis. The pancreas is edematous with extensive free fluid collections along the LEFT RIGHT anterior para renal fascia. No organized collections. 2. Pancreas enhances uniformly.  No vascular complication 3. Fluid in the Lesser omentum with secondary inflammation of the transverse colon. 4. Fluid in the pelvis also related to pancreatitis. Electronically Signed   By: Genevive Bi M.D.   On: 10/31/2015 09:15   Mr Roe Coombs W/wo Cm/mrcp  10/31/2015  CLINICAL DATA:  Abdominal pain, nausea, and vomiting for 2 days. Diarrhea. Elevated lipase. Pancreatitis. EXAM: MRI ABDOMEN WITHOUT AND WITH CONTRAST (INCLUDING MRCP) TECHNIQUE: Multiplanar multisequence MR imaging  of the abdomen was performed both before and after the administration of intravenous contrast. Heavily T2-weighted images of the biliary and pancreatic ducts were obtained, and three-dimensional MRCP images were rendered by post processing. CONTRAST:  15mL MULTIHANCE GADOBENATE DIMEGLUMINE 529 MG/ML IV SOLN COMPARISON:  CT scan from 10/31/2015 FINDINGS: Lower chest:  Small left and trace right pleural effusions. Hepatobiliary: No focal hepatic lesion or significant intrahepatic biliary dilatation. Common bile duct measures up to 10 mm diameter. There numerous tiny gallstones loose in the neck of the gallbladder, as shown on images 13 through 19 of series 6. The possibility of a tiny stone wedged in the ampulla is raised on image 23 of series 6 although the finding is highly subtle. Minimal biliary wall enhancement, not highly characteristic for over cholangitis at this time. There is some fluid adjacent to the gallbladder but this may be simply related to the diffuse ascites. The gallbladder wall is not appreciably thickened. Pancreas: Diffusely edematous pancreas compatible with acute pancreatitis. Corresponding to the lucencies on CT there is some faint areas of hypo enhancement which may represent dilated side ducts. No discrete abscess, and the appearance is not characteristic for pancreatic necrosis. The degree of peripancreatic stranding is striking. Dorsal pancreatic duct is not overtly dilated. No pseudocyst. Acute peripancreatic fluid collections are present but there is also generalized ascites and mesenteric edema in the upper abdomen. Spleen: Unremarkable.  Splenic vein patent. Adrenals/Urinary Tract: Unremarkable where visualized. Stomach/Bowel: No dilated upper abdominal bowel. Vascular/Lymphatic: Unremarkable Other: Diffuse mesenteric edema and ascites. Musculoskeletal: Unremarkable IMPRESSION: 1. Dilated common bile duct, with numerous tiny gallstones layering dependently in the neck of the  gallbladder. Subtle suggestion of a tiny gallstone  in the ampulla on a single image, although the finding is indeed subtle. 2. Extensive pancreatitis with acute peripancreatic fluid collections, mesenteric edema, and considerable ascites. Small left and trace right pleural effusions. 3. Several small foci of hypoenhancement in the pancreas, likely from accumulated fluid and irregular dilated side ducts, less likely to be from pancreatic necrosis or early abscess formation, although a low threshold for reimaging is suggested given the general severity of the pancreatitis. Electronically Signed   By: Gaylyn Rong M.D.   On: 10/31/2015 17:01    ASSESSMENT AND PLAN:   Principal Problem:   Acute pancreatitis Active Problems:   Gallstone   Pancreatitis, acute   Right upper quadrant abdominal pain   Pancreatitis, gallstone   * acute pancreatitis   Likely bile duct stone,   LFTs slightly better    Appreciated help of surgery and GI services.   Dr. Shelle Iron called GI oncall at [facility]- and they suggested non intervention- no need for ERCP at this time.    Check for Lipid panel.    Cont pain meds and IV fluids.   As per MRCP= sludge and some small stones, but no impected stone seen in CBD.   Pain is better controlled now.    No BM for 2-3 days- get Xray KUB.  * UTI   Started on rocephin IV.  All the records are reviewed and case discussed with Care Management/Social Workerr. Management plans discussed with the patient, family and they are in agreement.  CODE STATUS: full.  TOTAL TIME TAKING CARE OF THIS PATIENT: 35 minutes.    POSSIBLE D/C IN 2-3 DAYS, DEPENDING ON CLINICAL CONDITION.   Altamese Dilling M.D on 11/01/2015   Between 7am to 6pm - Pager - [phone]  After 6pm go to [email or web address] - password [email or web address] [facility]  [facility]  Office  [phone]  CC: Primary care physician; No primary care provider on file.  Note: This dictation was prepared  with Dragon dictation along with smaller phrase technology. Any transcriptional errors that result from this process are unintentional.

## 2015-11-02 ENCOUNTER — Encounter
Admission: EM | Disposition: A | Discharge status: Home / Self Care | Payer: Self-pay | Source: Home / Self Care | Attending: Internal Medicine

## 2015-11-02 ENCOUNTER — Encounter: Payer: Self-pay | Admitting: *Deleted

## 2015-11-02 ENCOUNTER — Inpatient Hospital Stay: Payer: Medicaid Other

## 2015-11-02 ENCOUNTER — Inpatient Hospital Stay: Payer: Medicaid Other | Admitting: Anesthesiology

## 2015-11-02 DIAGNOSIS — K851 Biliary acute pancreatitis without necrosis or infection: Secondary | ICD-10-CM

## 2015-11-02 HISTORY — PX: ERCP: SHX5425

## 2015-11-02 LAB — CBC
HCT: 34.8 % — ABNORMAL LOW (ref 35.0–47.0)
HEMOGLOBIN: 10.9 g/dL — AB (ref 12.0–16.0)
MCH: 24.3 pg — AB (ref 26.0–34.0)
MCHC: 31.2 g/dL — AB (ref 32.0–36.0)
MCV: 78 fL — ABNORMAL LOW (ref 80.0–100.0)
PLATELETS: 200 10*3/uL (ref 150–440)
RBC: 4.46 MIL/uL (ref 3.80–5.20)
RDW: 20.2 % — ABNORMAL HIGH (ref 11.5–14.5)
WBC: 18.4 10*3/uL — ABNORMAL HIGH (ref 3.6–11.0)

## 2015-11-02 LAB — URINE CULTURE: Culture: NO GROWTH

## 2015-11-02 LAB — COMPREHENSIVE METABOLIC PANEL
ALBUMIN: 2.5 g/dL — AB (ref 3.5–5.0)
ALK PHOS: 115 U/L (ref 38–126)
ALT: 147 U/L — AB (ref 14–54)
AST: 45 U/L — AB (ref 15–41)
Anion gap: 6 (ref 5–15)
BUN: 6 mg/dL (ref 6–20)
CALCIUM: 7.6 mg/dL — AB (ref 8.9–10.3)
CO2: 22 mmol/L (ref 22–32)
CREATININE: 0.5 mg/dL (ref 0.44–1.00)
Chloride: 110 mmol/L (ref 101–111)
GFR calc Af Amer: >60 mL/min (ref >60)
GFR calc non Af Amer: >60 mL/min (ref >60)
GLUCOSE: 71 mg/dL (ref 65–99)
Potassium: 3.5 mmol/L (ref 3.5–5.1)
SODIUM: 138 mmol/L (ref 135–145)
Total Bilirubin: 2.3 mg/dL — ABNORMAL HIGH (ref 0.3–1.2)
Total Protein: 5.4 g/dL — ABNORMAL LOW (ref 6.5–8.1)

## 2015-11-02 LAB — LIPASE, BLOOD: Lipase: 182 U/L — ABNORMAL HIGH (ref 11–51)

## 2015-11-02 SURGERY — ERCP, WITH INTERVENTION IF INDICATED
Anesthesia: General

## 2015-11-02 MED ORDER — PROPOFOL 10 MG/ML IV BOLUS
INTRAVENOUS | Status: DC | PRN
  Administered 2015-11-02: 80 mg via INTRAVENOUS

## 2015-11-02 MED ORDER — MIDAZOLAM HCL 2 MG/2ML IJ SOLN
INTRAMUSCULAR | Status: DC | PRN
  Administered 2015-11-02: 1 mg via INTRAVENOUS

## 2015-11-02 MED ORDER — ONDANSETRON HCL 4 MG/2ML IJ SOLN
INTRAMUSCULAR | Status: DC | PRN
  Administered 2015-11-02: 4 mg via INTRAVENOUS

## 2015-11-02 MED ORDER — SODIUM CHLORIDE 0.9 % IV SOLN
INTRAVENOUS | Status: DC
  Administered 2015-11-02: 13:00:00 via INTRAVENOUS

## 2015-11-02 MED ORDER — INDOMETHACIN 50 MG RE SUPP
100.0000 mg | Freq: Once | RECTAL | Status: AC
  Administered 2015-11-02: 100 mg via RECTAL
  Filled 2015-11-02: qty 2

## 2015-11-02 MED ORDER — PROPOFOL 500 MG/50ML IV EMUL
INTRAVENOUS | Status: DC | PRN
  Administered 2015-11-02: 140 ug/kg/min via INTRAVENOUS

## 2015-11-02 NOTE — Anesthesia Preprocedure Evaluation (Addendum)
Anesthesia Evaluation  Patient identified by MRN, date of birth, ID band Patient awake    Reviewed: Allergy & Precautions, H&P , NPO status , Patient's Chart, lab work & pertinent test results  Airway Mallampati: II  TM Distance: >3 FB Neck ROM: full    Dental no notable dental hx. (+) Teeth Intact   Pulmonary neg pulmonary ROS, neg shortness of breath,    Pulmonary exam normal breath sounds clear to auscultation       Cardiovascular Exercise Tolerance: Good (-) angina(-) Past MI and (-) DOE negative cardio ROS Normal cardiovascular exam Rhythm:regular Rate:Normal     Neuro/Psych negative neurological ROS  negative psych ROS   GI/Hepatic negative GI ROS, Neg liver ROS, neg GERD  ,  Endo/Other  negative endocrine ROS  Renal/GU negative Renal ROS  negative genitourinary   Musculoskeletal   Abdominal   Peds  Hematology negative hematology ROS (+)   Anesthesia Other Findings severe gallstone pancreatitis   Patient is NPO appropriate, and reports no nausea or vomiting.  BMI    Body Mass Index   30.71 kg/m 2      Reproductive/Obstetrics negative OB ROS                            Anesthesia Physical Anesthesia Plan  ASA: III  Anesthesia Plan: General   Post-op Pain Management:    Induction:   Airway Management Planned:   Additional Equipment:   Intra-op Plan:   Post-operative Plan:   Informed Consent: I have reviewed the patients History and Physical, chart, labs and discussed the procedure including the risks, benefits and alternatives for the proposed anesthesia with the patient or authorized representative who has indicated his/her understanding and acceptance.   Dental Advisory Given  Plan Discussed with: Anesthesiologist, CRNA and Surgeon  Anesthesia Plan Comments:        Anesthesia Quick Evaluation

## 2015-11-02 NOTE — Progress Notes (Signed)
Patient ID: Mallory Shields, female   DOB: 04/13/1996, 19 y.o.   MRN: [id number] Pt still having pain, says it is a little worse than yesterday. AVSS. Abdomen is tender in epigastric and ruq areas-moderate to severe. No guarding . Active bowel sounds. Lungs clear. Labs- WBC still up 18k, lfts seem to be trending down-bili down to 2.3. Lipase not done today- was 862 yesterday. Discussed with Dr. Bluford Kaufmannh from GI. He plans for ERCP this afternoon. Once pancreatitis is resolved and ERCP completed will need cholecystectomy. Pt advised.

## 2015-11-02 NOTE — Progress Notes (Signed)
OKay to change NPO order to include ice chips per Dr. Elisabeth PigeonVachhani

## 2015-11-02 NOTE — Progress Notes (Signed)
[facility] [place] [facility]   PATIENT NAME: Mallory Shields    MR#:  [id number]  DATE OF BIRTH:  01/07/1996  SUBJECTIVE:  CHIEF COMPLAINT:   Chief Complaint  Patient presents with  . Abdominal Pain    Pain is still there, seen in morning- awaiting ERCP. LFTs are better.  REVIEW OF SYSTEMS:   CONSTITUTIONAL: No fever, but has poor oral intake and generalized weakness.  EYES: No blurred or double vision.  EARS, NOSE, AND THROAT: No tinnitus or ear pain.  RESPIRATORY: No cough, shortness of breath, wheezing or hemoptysis.  CARDIOVASCULAR: No chest pain, orthopnea, edema.  GASTROINTESTINAL: Has nausea, no vomiting, diarrhea and have mild abdominal pain.  GENITOURINARY: No dysuria, hematuria. C/o dark urine. ENDOCRINE: No polyuria, nocturia,  HEMATOLOGY: No anemia, easy bruising or bleeding SKIN: No rash or lesion. MUSCULOSKELETAL: No joint pain or arthritis.  NEUROLOGIC: No tingling, numbness, weakness.  PSYCHIATRY: No anxiety or depression.  ROS  DRUG ALLERGIES:  No Known Allergies  VITALS:  Blood pressure 102/63, pulse 102, temperature 98.4 F (36.9 C), temperature source Oral, resp. rate 17, height 5\' 4"  (1.626 m), weight 81.194 kg (179 lb), SpO2 92 %, currently breastfeeding.  PHYSICAL EXAMINATION:   GENERAL: 19 y.o.-year-old patient lying in the bed with no acute distress. Obese. EYES: Pupils equal, round, reactive to light and accommodation. No scleral icterus. Extraocular muscles intact.  HEENT: Head atraumatic, normocephalic. Oropharynx and nasopharynx clear.  NECK: Supple, no jugular venous distention. No thyroid enlargement, no tenderness.  LUNGS: Normal breath sounds bilaterally, no wheezing, rales,rhonchi or crepitation. No use of accessory muscles of respiration.  CARDIOVASCULAR: S1, S2 normal. No murmurs, rubs, or gallops.  ABDOMEN: Soft, tender, epigastric tenderness. Bowel sounds present. No organomegaly  or mass. Negative Murphy's sign. EXTREMITIES: No pedal edema, cyanosis, or clubbing.  NEUROLOGIC: Cranial nerves II through XII are intact. Muscle strength 5/5 in all extremities. Sensation intact. Gait not checked.  PSYCHIATRIC: The patient is alert and oriented x 3.  SKIN: No obvious rash, lesion, or ulcer.  Physical Exam LABORATORY PANEL:   CBC  Recent Labs Lab 11/02/15 0531  WBC 18.4*  HGB 10.9*  HCT 34.8*  PLT 200   ------------------------------------------------------------------------------------------------------------------  Chemistries   Recent Labs Lab 10/30/15 1108  11/02/15 0531  NA 138  < > 138  K 4.5  < > 3.5  CL 105  < > 110  CO2 24  < > 22  GLUCOSE 181*  < > 71  BUN 7  < > 6  CREATININE 0.41*  < > 0.50  CALCIUM 9.7  < > 7.6*  MG 1.8  --   --   AST 789*  < > 45*  ALT 633*  < > 147*  ALKPHOS 170*  < > 115  BILITOT 4.0*  < > 2.3*  < > = values in this interval not displayed. ------------------------------------------------------------------------------------------------------------------  Cardiac Enzymes No results for input(s): TROPONINI in the last 168 hours. ------------------------------------------------------------------------------------------------------------------  RADIOLOGY:  Dg Abd 1 View  11/02/2015  CLINICAL DATA:  Abdominal distension.  Pancreatitis. EXAM: ABDOMEN - 1 VIEW COMPARISON:  CT 10/31/2015 FINDINGS: Mild gaseous distention of the colon, likely related to mild ileus. No free air organomegaly. No suspicious calcification. Low lung volumes with left lower lobe atelectasis or infiltrate and probable small left effusion. IMPRESSION: No evidence of obstruction or free air. Left lower lobe atelectasis or infiltrate with small left effusion. Electronically Signed   By: Charlett NoseKevin  Dover M.D.  On: 11/02/2015 07:46   Dg C-arm 1-60 Min-no Report  11/02/2015  CLINICAL DATA: ERCP C-ARM 1-60 MINUTES Fluoroscopy was utilized by the  requesting physician.  No radiographic interpretation.    ASSESSMENT AND PLAN:   Principal Problem:   Acute pancreatitis Active Problems:   Gallstone   Pancreatitis, acute   Right upper quadrant abdominal pain   Pancreatitis, gallstone   * acute pancreatitis   Likely bile duct stone,   LFTs slightly better    Appreciated help of surgery and GI services.   Dr. Shelle Iron called GI oncall at [facility] on 10/31/15- and they suggested non intervention- no need for ERCP at this time.    Check for Lipid panel.    Cont pain meds and IV fluids.   As per MRCP= sludge and some small stones, but no impected stone seen in CBD.   Pain is better controlled now.    No BM for 2-3 days- no obstruction per KUB.   awaited for ERCP.  * UTI   Started on rocephin IV.  urine culture negative for 1 day.  * tachycardia   Likely due to pain, monitor.  All the records are reviewed and case discussed with Care Management/Social Workerr. Management plans discussed with the patient, family and they are in agreement.  CODE STATUS: full.  TOTAL TIME TAKING CARE OF THIS PATIENT: 35 minutes.   POSSIBLE D/C IN 2-3 DAYS, DEPENDING ON CLINICAL CONDITION. Discussed with her mother in room.  Altamese Dilling M.D on 11/02/2015   Between 7am to 6pm - Pager - [phone]  After 6pm go to [email or web address] - password [email or web address] [facility]  [facility]  Office  [phone]  CC: Primary care physician; No primary care provider on file.  Note: This dictation was prepared with Dragon dictation along with smaller phrase technology. Any transcriptional errors that result from this process are unintentional.

## 2015-11-02 NOTE — Transfer of Care (Signed)
Immediate Anesthesia Transfer of Care Note  Patient: Mallory Shields  Procedure(s) Performed: Procedure(s): ENDOSCOPIC RETROGRADE CHOLANGIOPANCREATOGRAPHY (ERCP) (N/A)  Patient Location: PACU and Endoscopy Unit  Anesthesia Type:General  Level of Consciousness: patient cooperative and lethargic  Airway & Oxygen Therapy: Patient Spontanous Breathing and Patient connected to nasal cannula oxygen  Post-op Assessment: Report given to RN and Post -op Vital signs reviewed and stable  Post vital signs: Reviewed and stable  Last Vitals:  Filed Vitals:   11/02/15 1529 11/02/15 1531  BP: 106/73 106/73  Pulse: 125   Temp: 37.9 C 38.3 C  Resp: 28 23    Complications: No apparent anesthesia complications

## 2015-11-02 NOTE — Op Note (Signed)
ERCP for persistent abdominal pain, elevated WBC for gallstone pancreatitis. LFT improving but MRCP did show dilated CBD with possible stone at distal CBD. ERCP showed normal appearing CBD. No stone or sludge seen. Biliary sphincterotomy done with good bile drainage. Suspect pt already passed the stone. Expect LFT to continue to improve. Once pancreatitis resolves, should have GB surgery. Thanks.

## 2015-11-02 NOTE — Progress Notes (Signed)
Notified Dr. Elisabeth PigeonVachhani of elevated heart rate, no new orders recieved

## 2015-11-02 NOTE — Anesthesia Postprocedure Evaluation (Signed)
Anesthesia Post Note  Patient: Mallory Shields  Procedure(s) Performed: Procedure(s) (LRB): ENDOSCOPIC RETROGRADE CHOLANGIOPANCREATOGRAPHY (ERCP) (N/A)  Patient location during evaluation: Endoscopy Anesthesia Type: General Level of consciousness: awake and alert Pain management: pain level controlled Vital Signs Assessment: post-procedure vital signs reviewed and stable Respiratory status: spontaneous breathing, nonlabored ventilation, respiratory function stable and patient connected to nasal cannula oxygen Cardiovascular status: blood pressure returned to baseline and stable Postop Assessment: no signs of nausea or vomiting Anesthetic complications: no    Last Vitals:  Filed Vitals:   11/02/15 1558 11/02/15 1623  BP: 123/75 112/62  Pulse: 113 114  Temp:  36.9 C  Resp: 23 17    Last Pain:  Filed Vitals:   11/02/15 1645  PainSc: 9                  Joseph K Piscitello

## 2015-11-02 NOTE — Op Note (Signed)
[facility] Gastroenterology Patient Name: Mallory FieldSandy Alejo Shields Procedure Date: 11/02/2015 2:53 PM MRN: [id number] Account #: [id number] Date of Birth: 08/22/1996 Admit Type: Inpatient Age: [age] [facility] Gender: Female Note Status: Finalized Procedure:         ERCP Indications:       Abdominal pain of suspected biliary or pancreatic origin,                     Abnormal MRCP, Abnormal liver function test Providers:         Ezzard StandingPaul Y. Bluford Kaufmannh, Shields Referring Shields:      Sallye LatNo Local Md, Shields (Referring Shields) Medicines:         Monitored Anesthesia Care Complications:     Mallory immediate complications. Procedure:         Pre-Anesthesia Assessment:                    - Prior to the procedure, a History and Physical was                     performed, and patient medications, allergies and                     sensitivities were reviewed. The patient's tolerance of                     previous anesthesia was reviewed.                    - The risks and benefits of the procedure and the sedation                     options and risks were discussed with the patient. All                     questions were answered and informed consent was obtained.                    - After reviewing the risks and benefits, the patient was                     deemed in satisfactory condition to undergo the procedure.                    After obtaining informed consent, the scope was passed                     under direct vision. Throughout the procedure, the                     patient's blood pressure, pulse, and oxygen saturations                     were monitored continuously. The ERCP was introduced                     through the mouth, and used to inject contrast into and                     used to inject contrast into the bile duct. The ERCP was                     accomplished without difficulty. The patient tolerated the  procedure well. Findings:      The scout film  was normal. The esophagus was successfully intubated       under direct vision. The scope was advanced to a normal major papilla in       the descending duodenum without detailed examination of the pharynx,       larynx and associated structures, and upper GI tract. The upper GI tract       was grossly normal. The bile duct was deeply cannulated with the       short-nosed traction sphincterotome. Contrast was injected. I personally       interpreted the bile duct images. Ductal flow of contrast was adequate.       Image quality was adequate. Contrast extended to the entire biliary       tree. The intra-hepatic and extra-hepatic biliary duct system was       normal. A straight Roadrunner wire was passed into the biliary tree.       Biliary sphincterotomy was made with a monofilament traction (standard)       sphincterotome using ERBE electrocautery. The sphincterotomy oozed       blood. To discover objects, the biliary tree was swept with a 12 mm       balloon starting at the bifurcation. Nothing was found. Impression:        - The cholangiogram was normal.                    - A sphincterotomy was performed.                    - The biliary tree was swept and nothing was found.                    - Suspect stone/s passed already. Recommendation:    - Avoid aspirin and nonsteroidal anti-inflammatory                     medicines today.                    - Observe patient's clinical course.                    - The findings and recommendations were discussed with the                     patient.                    - Will need GB surgery once pancreatitis resolves. Procedure Code(s): --- Professional ---                    [phone], Endoscopic retrograde cholangiopancreatography                     (ERCP); with sphincterotomy/papillotomy Diagnosis Code(s): --- Professional ---                    R10.9, Unspecified abdominal pain                    R94.5, Abnormal results of liver function  studies                    R93.2, Abnormal findings on diagnostic imaging of liver  and biliary tract CPT copyright 2014 American Medical Association. All rights reserved. The codes documented in this report are preliminary and upon coder review may  be revised to meet current compliance requirements. Wallace Cullens, Shields 11/02/2015 3:29:07 PM This report has been signed electronically. Number of Addenda: 0 Note Initiated On: 11/02/2015 2:53 PM      [facility]

## 2015-11-03 ENCOUNTER — Inpatient Hospital Stay: Payer: Medicaid Other

## 2015-11-03 LAB — CBC
HCT: 31.9 % — ABNORMAL LOW (ref 35.0–47.0)
HEMOGLOBIN: 10.4 g/dL — AB (ref 12.0–16.0)
MCH: 25.2 pg — AB (ref 26.0–34.0)
MCHC: 32.5 g/dL (ref 32.0–36.0)
MCV: 77.3 fL — AB (ref 80.0–100.0)
Platelets: 199 10*3/uL (ref 150–440)
RBC: 4.13 MIL/uL (ref 3.80–5.20)
RDW: 20.1 % — ABNORMAL HIGH (ref 11.5–14.5)
WBC: 17.2 10*3/uL — ABNORMAL HIGH (ref 3.6–11.0)

## 2015-11-03 LAB — COMPREHENSIVE METABOLIC PANEL
ALK PHOS: 92 U/L (ref 38–126)
ALT: 97 U/L — AB (ref 14–54)
AST: 26 U/L (ref 15–41)
Albumin: 2.3 g/dL — ABNORMAL LOW (ref 3.5–5.0)
Anion gap: 7 (ref 5–15)
BUN: 5 mg/dL — ABNORMAL LOW (ref 6–20)
CO2: 19 mmol/L — AB (ref 22–32)
Calcium: 7.7 mg/dL — ABNORMAL LOW (ref 8.9–10.3)
Chloride: 111 mmol/L (ref 101–111)
Creatinine, Ser: 0.37 mg/dL — ABNORMAL LOW (ref 0.44–1.00)
GFR calc non Af Amer: >60 mL/min (ref >60)
GLUCOSE: 69 mg/dL (ref 65–99)
Potassium: 3.5 mmol/L (ref 3.5–5.1)
SODIUM: 137 mmol/L (ref 135–145)
Total Bilirubin: 1.6 mg/dL — ABNORMAL HIGH (ref 0.3–1.2)
Total Protein: 5.2 g/dL — ABNORMAL LOW (ref 6.5–8.1)

## 2015-11-03 LAB — HCG, QUANTITATIVE, PREGNANCY: hCG, Beta Chain, Quant, S: 2 m[IU]/mL (ref <5)

## 2015-11-03 MED ORDER — MORPHINE SULFATE ER 15 MG PO TBCR
15.0000 mg | EXTENDED_RELEASE_TABLET | Freq: Two times a day (BID) | ORAL | Status: DC
  Administered 2015-11-03 – 2015-11-06 (×5): 15 mg via ORAL
  Filled 2015-11-03 (×5): qty 1

## 2015-11-03 MED ORDER — DILTIAZEM HCL 30 MG PO TABS
30.0000 mg | ORAL_TABLET | Freq: Three times a day (TID) | ORAL | Status: DC
  Administered 2015-11-03 – 2015-11-06 (×6): 30 mg via ORAL
  Filled 2015-11-03 (×6): qty 1

## 2015-11-03 MED ORDER — IOHEXOL 350 MG/ML SOLN
85.0000 mL | Freq: Once | INTRAVENOUS | Status: AC | PRN
  Administered 2015-11-03: 85 mL via INTRAVENOUS

## 2015-11-03 MED ORDER — TECHNETIUM TC 99M MEBROFENIN IV KIT
5.0700 | PACK | Freq: Once | INTRAVENOUS | Status: AC | PRN
Start: 2015-11-03 — End: 2015-11-03
  Administered 2015-11-03: 5.07 via INTRAVENOUS

## 2015-11-03 MED ORDER — MORPHINE SULFATE ER 15 MG PO TBCR: 15.0000 mg | EXTENDED_RELEASE_TABLET | Freq: Two times a day (BID) | ORAL | Status: DC

## 2015-11-03 NOTE — Progress Notes (Signed)
Dr. Elisabeth PigeonVachhani called requesting vital signs on patient.  Vitals were done and put into flowsheet per Alcario DroughtErica, nurse tech. Arturo MortonClay, Reyan Helle N  11/03/2015  8:53 PM

## 2015-11-03 NOTE — Progress Notes (Signed)
Okay per Dr. Birdie SonsByrnette to place order for serum pregnancy

## 2015-11-03 NOTE — Progress Notes (Signed)
GI Inpatient Follow-up Note  Patient Identification: Mallory Shields is a 19 y.o. female with gallstone panc  Subjective:  ABd pain improved today.  Much less. No f/c.  No n/v.  Wants to try liquids.   Scheduled Inpatient Medications:  . cefTRIAXone (ROCEPHIN)  IV  1 g Intravenous Q24H  . diltiazem  30 mg Oral 3 times per day  . metronidazole  500 mg Intravenous Q6H  . morphine  15 mg Oral Q12H    Continuous Inpatient Infusions:   . 0.9 % NaCl with KCl 20 mEq / L 150 mL/hr at 11/03/15 2049    PRN Inpatient Medications:  acetaminophen **OR** acetaminophen, albuterol, HYDROmorphone (DILAUDID) injection, ondansetron **OR** ondansetron (ZOFRAN) IV, oxyCODONE  Review of Systems: Constitutional: Weight is stable.  Eyes: No changes in vision. ENT: No oral lesions, sore throat.  GI: see HPI.  Heme/Lymph: No easy bruising.  CV: +c/p GU: No hematuria.  Integumentary: No rashes.  Neuro: No headaches.  Psych: No depression/anxiety.  Endocrine: No heat/cold intolerance.  Allergic/Immunologic: No urticaria.  Resp: No cough, +SOB.  Musculoskeletal: No joint swelling.    Physical Examination: BP 105/67 mmHg  Pulse 107  Temp(Src) 98.1 F (36.7 C) (Oral)  Resp 18  Ht 5\' 4"  (1.626 m)  Wt 81.194 kg (179 lb)  BMI 30.71 kg/m2  SpO2 93%  LMP   Breastfeeding? Yes Gen: NAD, alert and oriented x 4 Neck: supple, no JVD or thyromegaly Chest: CTA bilaterally, no wheezes, crackles, or other adventitious sounds CV: RRR, no m/g/c/r Abd: + diffuse TTP, worse in epigastrium, no r/g, decreased b/s Ext: no edema, well perfused with 2+ pulses, Skin: no rash or lesions noted Lymph: no LAD  Data: Lab Results  Component Value Date   WBC 17.2* 11/03/2015   HGB 10.4* 11/03/2015   HCT 31.9* 11/03/2015   MCV 77.3* 11/03/2015   PLT 199 11/03/2015    Recent Labs Lab 11/01/15 0425 11/02/15 0531 11/03/15 0518  HGB 12.4 10.9* 10.4*   Lab Results  Component Value Date   NA 137  11/03/2015   K 3.5 11/03/2015   CL 111 11/03/2015   CO2 19* 11/03/2015   BUN 5* 11/03/2015   CREATININE 0.37* 11/03/2015   Lab Results  Component Value Date   ALT 97* 11/03/2015   AST 26 11/03/2015   ALKPHOS 92 11/03/2015   BILITOT 1.6* 11/03/2015   No results for input(s): APTT, INR, PTT in the last 168 hours.   Assessment/Plan: Mallory Shields is a 19 y.o. female with gallstone panc.  ERCP unremarkable.  LFT trending down nicely and pain much better  Recommendations: - advance diet to clear liquid, no broth - cont IVF, pain control.  - cholecystectomy tbd  per surgery  Please call with questions or concerns.  REIN, Addison NaegeliMATTHEW GORDON, MD

## 2015-11-03 NOTE — Progress Notes (Signed)
Tmax 101.4 pre ERCP, 100.4 post procedure. Afebrile overnight. Constant, dull pain present prior to procedure has resolved, replaced with intermittent, sharp epigastric pain, relieved w/ po meds and then asymptomatic for 3 +/- hours. No nausea. Thirsty. + BM. Lungs: Clear. Cardio: Tachycardia: 120 +/-.  Better than prior to procedure. ABD: Obese, soft. Mild/ moderate diffuse tenderness. L LQ= RUQ> remainder of abdomen. Normal BS. Extrem: Soft. Labs: WBC minimally improved at 17,2K.  HGB: down to 10.4. 12 three days ago. LFT's improved. Lipase 12/12 continues downward trend. IMP: Acute pancreatitis, biliary source. Diffuse abdominal pain is likely from mesenteric edema/ ascites noted on most recent CT. Will check HIDA and plain films today to assess what component may be related to acute cholecystitis.  Consider beta blockers for tachycardia.

## 2015-11-03 NOTE — Progress Notes (Signed)
Okay per Dr. Shelle Ironein to place patient on clear liquid diet with no broth

## 2015-11-03 NOTE — Progress Notes (Signed)
[facility] -  at [facility]   PATIENT NAME: Mallory Shields    MR#:  [id number]  DATE OF BIRTH:  1996-03-12  SUBJECTIVE:  CHIEF COMPLAINT:   Chief Complaint  Patient presents with  . Abdominal Pain    Pain is still there, seen in morning- s/p ERCp 11/02/15- with some drainage of bile but no stone. LFTs are better. No BM.  REVIEW OF SYSTEMS:   CONSTITUTIONAL: No fever, generalized weakness.  EYES: No blurred or double vision.  EARS, NOSE, AND THROAT: No tinnitus or ear pain.  RESPIRATORY: No cough, shortness of breath, wheezing or hemoptysis.  CARDIOVASCULAR: No chest pain, orthopnea, edema.  GASTROINTESTINAL: Has nausea, no vomiting, diarrhea and have mild- moderate abdominal pain.No BM for few days now. GENITOURINARY: No dysuria, hematuria. C/o dark urine. ENDOCRINE: No polyuria, nocturia. HEMATOLOGY: No anemia, easy bruising or bleeding SKIN: No rash or lesion. MUSCULOSKELETAL: No joint pain or arthritis.  NEUROLOGIC: No tingling, numbness, weakness.  PSYCHIATRY: No anxiety or depression. But appears concerned about her pain not going away. ROS  DRUG ALLERGIES:  No Known Allergies  VITALS:  Blood pressure 100/59, pulse 138, temperature 99.1 F (37.3 C), temperature source Oral, resp. rate 20, height  (1.626 m), weight 81.194 kg (179 lb), SpO2 95 %, currently breastfeeding.  PHYSICAL EXAMINATION:   GENERAL: 19 y.o.-year-old patient lying in the bed with no acute distress. Obese. EYES: Pupils equal, round, reactive to light and accommodation. No scleral icterus. Extraocular muscles intact.  HEENT: Head atraumatic, normocephalic. Oropharynx and nasopharynx clear.  NECK: Supple, no jugular venous distention. No thyroid enlargement, no tenderness.  LUNGS: Normal breath sounds bilaterally, no wheezing, rales,rhonchi or crepitation. No use of accessory muscles of respiration.  CARDIOVASCULAR: S1, S2 normal. No murmurs,  rubs, or gallops.  ABDOMEN: Soft, tender, epigastric tenderness. Bowel sounds sluggish. No organomegaly or mass. Negative Murphy's sign. EXTREMITIES: No pedal edema, cyanosis, or clubbing.  NEUROLOGIC: Cranial nerves II through XII are intact. Muscle strength 5/5 in all extremities. Sensation intact. Gait not checked.  PSYCHIATRIC: The patient is alert and oriented x 3.  SKIN: No obvious rash, lesion, or ulcer.  Physical Exam LABORATORY PANEL:   CBC  Recent Labs Lab 11/03/15 0518  WBC 17.2*  HGB 10.4*  HCT 31.9*  PLT 199   ------------------------------------------------------------------------------------------------------------------  Chemistries   Recent Labs Lab 10/30/15 1108  11/03/15 0518  NA 138  < > 137  K 4.5  < > 3.5  CL 105  < > 111  CO2 24  < > 19*  GLUCOSE 181*  < > 69  BUN 7  < > 5*  CREATININE 0.41*  < > 0.37*  CALCIUM 9.7  < > 7.7*  MG 1.8  --   --   AST 789*  < > 26  ALT 633*  < > 97*  ALKPHOS 170*  < > 92  BILITOT 4.0*  < > 1.6*  < > = values in this interval not displayed. ------------------------------------------------------------------------------------------------------------------  Cardiac Enzymes No results for input(s): TROPONINI in the last 168 hours. ------------------------------------------------------------------------------------------------------------------  RADIOLOGY:  Dg Abd 1 View  11/02/2015  CLINICAL DATA:  Abdominal distension.  Pancreatitis. EXAM: ABDOMEN - 1 VIEW COMPARISON:  CT 10/31/2015 FINDINGS: Mild gaseous distention of the colon, likely related to mild ileus. No free air organomegaly. No suspicious calcification. Low lung volumes with left lower lobe atelectasis or infiltrate and probable small left effusion. IMPRESSION: No evidence of obstruction or free air. Left lower lobe  atelectasis or infiltrate with small left effusion. Electronically Signed   By: Charlett Nose M.D.   On: 11/02/2015 07:46   Nm Hepatobiliary  Liver Func  11/03/2015  CLINICAL DATA:  Biliary/gallstone pancreatitis, abdominal pain since 10/29/2015, nausea since yesterday EXAM: NUCLEAR MEDICINE HEPATOBILIARY IMAGING TECHNIQUE: Sequential images of the abdomen were obtained out to 60 minutes following intravenous administration of radiopharmaceutical. RADIOPHARMACEUTICALS:  5.07 mCi Tc-29m Choletec IV COMPARISON:  None ; correlation CT abdomen 11/03/2015, ultrasound abdomen 10/30/2015 FINDINGS: Normal uptake, concentration and excretion of tracer. Gallbladder visualized at 38 minutes. Small bowel visualized at 9 minutes. No focal hepatic retention of tracer. IMPRESSION: Patent biliary tree. Electronically Signed   By: Ulyses Southward M.D.   On: 11/03/2015 15:28   Ct Abdomen Pelvis W Contrast  11/03/2015  CLINICAL DATA:  Persistent abdominal pain, acute pancreatitis EXAM: CT ABDOMEN AND PELVIS WITH CONTRAST TECHNIQUE: Multidetector CT imaging of the abdomen and pelvis was performed using the standard protocol following bolus administration of intravenous contrast. CONTRAST:  85mL OMNIPAQUE IOHEXOL 350 MG/ML SOLN COMPARISON:  10/31/2015 FINDINGS: Lower chest: Enlarging non loculated pleural effusions with increased bibasilar collapse/ consolidation, worse in the left lower lobe. Normal heart size. No pericardial effusion. Hepatobiliary: No focal hepatic abnormality or biliary dilatation. Gallbladder contains contrast and an air-fluid level in the fundus without significant distention. No biliary dilatation. Pancreas: Similar pattern of diffuse peri pancreatic strandy edema and free fluid. Stable small focus of nonenhancement within the pancreas body. Free fluid and edema again present throughout the mesenteric and the pericolic gutters bilaterally. similar amount of dependent pelvic free fluid. No significant interval change. No pancreatic duct dilatation. Spleen: Within normal limits in size and appearance. Adrenals/Urinary Tract: No masses identified. No  evidence of hydronephrosis. Stomach/Bowel: Negative bowel obstruction, significant dilatation, ileus, or free air. Normal appendix demonstrated. Vascular/Lymphatic: No pathologically enlarged lymph nodes. No evidence of abdominal aortic aneurysm. Reproductive: Small 2.6 cm left ovarian cyst present, image 80. Uterus and adnexa otherwise unremarkable. Other: No inguinal abnormality or hernia. Slight increased diffuse body wall/ flank edema compatible with anasarca. Abdominal injection sites noted anteriorly. Musculoskeletal:  No acute osseous finding. IMPRESSION: Stable appearance of the moderate to severe acute pancreatitis with diffuse peripancreatic and mesenteric edema with retroperitoneal, abdominal, and pelvic free fluid. No other complicating feature or significant interval change. Vicarious contrast excretion within the gallbladder. Air within the fundus of the gallbladder as well, nonspecific. Stable small 2.6 cm left ovarian cyst Enlarging non loculated pleural effusions with increased bibasilar collapse/consolidation Electronically Signed   By: Judie Petit.  Shick M.D.   On: 11/03/2015 14:22   Dg Abd 2 Views  11/03/2015  CLINICAL DATA:  Pancreatitis. EXAM: ABDOMEN - 2 VIEW COMPARISON:  11/03/2015, CT of the abdomen pelvis. FINDINGS: There is mild prominence of the colon, partly air-filled, with air-fluid levels. Several air-fluid levels are noted in nondilated small bowel. There is no evidence of obstruction. Findings are most consistent with an adynamic ileus. No free air. Soft tissues are unremarkable. Bony structures are unremarkable. IMPRESSION: Mild gaseous distention of the colon with colonic and small bowel air-fluid levels, findings consistent with a diffuse adynamic ileus. No evidence of obstruction. No free air. Electronically Signed   By: Amie Portland M.D.   On: 11/03/2015 14:16   Dg C-arm 1-60 Min-no Report  11/02/2015  CLINICAL DATA: ERCP C-ARM 1-60 MINUTES Fluoroscopy was utilized by the  requesting physician.  No radiographic interpretation.    ASSESSMENT AND PLAN:   Principal Problem:   Acute  pancreatitis Active Problems:   Gallstone   Pancreatitis, acute   Right upper quadrant abdominal pain   Pancreatitis, gallstone   * acute pancreatitis   possible bile duct stone,   LFTs slightly better    Appreciated help of surgery and GI services.   Dr. Shelle Ironein [facility] on 10/31/15- and they suggested non intervention- no need for ERCP at that time.    Checked for Lipid panel.    Cont pain meds and IV fluids.   As per MRCP= sludge and some small stones, but no impected stone seen in CBD.    ERCP done 12/121/6- showed no stone or sludge, sphinterotomy done- removed some bile.     No BM for 2-3 days- no obstruction per KUB per X ray 12/12- but repeated Xray on 11/03/15 have Ileus.    Due to continued pain- will get repeat CT abdomen with contrast.  * UTI    on rocephin IV.   No growth on Culture Urine.  urine culture negative for 1 day.  * tachycardia   Likely due to pain, monitor.  * tachycardia   Due to pain.  * leukocytosis   Due to acute pancreatitis, monitor.  All the records are reviewed and case discussed with Care Management/Social Workerr. Management plans discussed with the patient, family and they are in agreement.  CODE STATUS: full.  TOTAL TIME TAKING CARE OF THIS PATIENT: 35 minutes.   POSSIBLE D/C IN 2-3 DAYS, DEPENDING ON CLINICAL CONDITION.  Altamese DillingVACHHANI, Bellah Alia M.D on 11/03/2015   Between 7am to 6pm - Pager - [phone]  After 6pm go to [email or web address] - password [email or web address] [email or web address]  [facility]  Office  [phone]  CC: Primary care physician; No primary care provider on file.  Note: This dictation was prepared with Dragon dictation along with smaller phrase technology. Any transcriptional errors that result from this process are unintentional.

## 2015-11-04 ENCOUNTER — Encounter: Payer: Self-pay | Admitting: Gastroenterology

## 2015-11-04 ENCOUNTER — Inpatient Hospital Stay: Payer: Medicaid Other

## 2015-11-04 LAB — COMPREHENSIVE METABOLIC PANEL
ALT: 73 U/L — AB (ref 14–54)
AST: 21 U/L (ref 15–41)
Albumin: 2.3 g/dL — ABNORMAL LOW (ref 3.5–5.0)
Alkaline Phosphatase: 82 U/L (ref 38–126)
Anion gap: 7 (ref 5–15)
BILIRUBIN TOTAL: 0.8 mg/dL (ref 0.3–1.2)
CHLORIDE: 110 mmol/L (ref 101–111)
CO2: 20 mmol/L — ABNORMAL LOW (ref 22–32)
Calcium: 7.5 mg/dL — ABNORMAL LOW (ref 8.9–10.3)
Glucose, Bld: 76 mg/dL (ref 65–99)
Potassium: 3.3 mmol/L — ABNORMAL LOW (ref 3.5–5.1)
Sodium: 137 mmol/L (ref 135–145)
TOTAL PROTEIN: 5.1 g/dL — AB (ref 6.5–8.1)

## 2015-11-04 LAB — CBC
HEMATOCRIT: 29.5 % — AB (ref 35.0–47.0)
Hemoglobin: 9.2 g/dL — ABNORMAL LOW (ref 12.0–16.0)
MCH: 24.6 pg — AB (ref 26.0–34.0)
MCHC: 31.1 g/dL — ABNORMAL LOW (ref 32.0–36.0)
MCV: 79.2 fL — AB (ref 80.0–100.0)
PLATELETS: 234 10*3/uL (ref 150–440)
RBC: 3.73 MIL/uL — ABNORMAL LOW (ref 3.80–5.20)
RDW: 20.4 % — AB (ref 11.5–14.5)
WBC: 17.3 10*3/uL — AB (ref 3.6–11.0)

## 2015-11-04 MED ORDER — POTASSIUM CHLORIDE CRYS ER 20 MEQ PO TBCR
20.0000 meq | EXTENDED_RELEASE_TABLET | Freq: Two times a day (BID) | ORAL | Status: DC
  Administered 2015-11-04 (×2): 20 meq via ORAL
  Filled 2015-11-04 (×2): qty 1

## 2015-11-04 MED ORDER — CEFTRIAXONE SODIUM 2 G IJ SOLR
2.0000 g | INTRAMUSCULAR | Status: DC
  Filled 2015-11-04: qty 2

## 2015-11-04 MED ORDER — CIPROFLOXACIN IN D5W 400 MG/200ML IV SOLN
400.0000 mg | Freq: Two times a day (BID) | INTRAVENOUS | Status: DC
  Administered 2015-11-04 – 2015-11-06 (×4): 400 mg via INTRAVENOUS
  Filled 2015-11-04 (×7): qty 200

## 2015-11-04 NOTE — Care Management (Signed)
Spoke with patient for discharge planning. Patient is alert and oriented from home with family and independent. Patient is on no home meds. Patient has insurance. No discharge needs anticipated.

## 2015-11-04 NOTE — Progress Notes (Signed)
CC: Epigastric pain Subjective: This a patient with biliary pancreatitis which has improved considerably. A negative common bile duct was identified on ERCP. But a papillotomy was performed. She feels better today. Minimal nausea and no emesis. Denies fevers or chills.  Objective: Vital signs in last 24 hours: Temp:  [98.1 F (36.7 C)-101.7 F (38.7 C)] 98.6 F (37 C) (12/14 0546) Pulse Rate:  [107-138] 119 (12/14 0546) Resp:  [18-20] 19 (12/14 0546) BP: (100-105)/(59-67) 105/60 mmHg (12/14 0546) SpO2:  [93 %-95 %] 95 % (12/14 0546) Last BM Date: 11/03/15  Intake/Output from previous day: 12/13 0701 - 12/14 0700 In: 3431 [P.O.:960; I.V.:2371; IV Piggyback:100] Out: 1300 [Urine:1300] Intake/Output this shift:    Physical exam:  No scleral icterus abdomen is soft and minimally tender in the epigastrium no Murphy sign nontender calves no jaundice  Lab Results: CBC   Recent Labs  11/03/15 0518 11/04/15 0014  WBC 17.2* 17.3*  HGB 10.4* 9.2*  HCT 31.9* 29.5*  PLT 199 234   BMET  Recent Labs  11/03/15 0518 11/04/15 0014  NA 137 137  K 3.5 3.3*  CL 111 110  CO2 19* 20*  GLUCOSE 69 76  BUN 5* <5*  CREATININE 0.37* <0.30*  CALCIUM 7.7* 7.5*   PT/INR No results for input(s): LABPROT, INR in the last 72 hours. ABG No results for input(s): PHART, HCO3 in the last 72 hours.  Invalid input(s): PCO2, PO2  Studies/Results: Nm Hepatobiliary Liver Func  11/03/2015  CLINICAL DATA:  Biliary/gallstone pancreatitis, abdominal pain since 10/29/2015, nausea since yesterday EXAM: NUCLEAR MEDICINE HEPATOBILIARY IMAGING TECHNIQUE: Sequential images of the abdomen were obtained out to 60 minutes following intravenous administration of radiopharmaceutical. RADIOPHARMACEUTICALS:  5.07 mCi Tc-8m Choletec IV COMPARISON:  None ; correlation CT abdomen 11/03/2015, ultrasound abdomen 10/30/2015 FINDINGS: Normal uptake, concentration and excretion of tracer. Gallbladder visualized at 38  minutes. Small bowel visualized at 9 minutes. No focal hepatic retention of tracer. IMPRESSION: Patent biliary tree. Electronically Signed   By: Ulyses Southward M.D.   On: 11/03/2015 15:28   Ct Abdomen Pelvis W Contrast  11/03/2015  CLINICAL DATA:  Persistent abdominal pain, acute pancreatitis EXAM: CT ABDOMEN AND PELVIS WITH CONTRAST TECHNIQUE: Multidetector CT imaging of the abdomen and pelvis was performed using the standard protocol following bolus administration of intravenous contrast. CONTRAST:  85mL OMNIPAQUE IOHEXOL 350 MG/ML SOLN COMPARISON:  10/31/2015 FINDINGS: Lower chest: Enlarging non loculated pleural effusions with increased bibasilar collapse/ consolidation, worse in the left lower lobe. Normal heart size. No pericardial effusion. Hepatobiliary: No focal hepatic abnormality or biliary dilatation. Gallbladder contains contrast and an air-fluid level in the fundus without significant distention. No biliary dilatation. Pancreas: Similar pattern of diffuse peri pancreatic strandy edema and free fluid. Stable small focus of nonenhancement within the pancreas body. Free fluid and edema again present throughout the mesenteric and the pericolic gutters bilaterally. similar amount of dependent pelvic free fluid. No significant interval change. No pancreatic duct dilatation. Spleen: Within normal limits in size and appearance. Adrenals/Urinary Tract: No masses identified. No evidence of hydronephrosis. Stomach/Bowel: Negative bowel obstruction, significant dilatation, ileus, or free air. Normal appendix demonstrated. Vascular/Lymphatic: No pathologically enlarged lymph nodes. No evidence of abdominal aortic aneurysm. Reproductive: Small 2.6 cm left ovarian cyst present, image 80. Uterus and adnexa otherwise unremarkable. Other: No inguinal abnormality or hernia. Slight increased diffuse body wall/ flank edema compatible with anasarca. Abdominal injection sites noted anteriorly. Musculoskeletal:  No acute  osseous finding. IMPRESSION: Stable appearance of the moderate to severe  acute pancreatitis with diffuse peripancreatic and mesenteric edema with retroperitoneal, abdominal, and pelvic free fluid. No other complicating feature or significant interval change. Vicarious contrast excretion within the gallbladder. Air within the fundus of the gallbladder as well, nonspecific. Stable small 2.6 cm left ovarian cyst Enlarging non loculated pleural effusions with increased bibasilar collapse/consolidation Electronically Signed   By: Judie PetitM.  Shick M.D.   On: 11/03/2015 14:22   Dg Abd 2 Views  11/03/2015  CLINICAL DATA:  Pancreatitis. EXAM: ABDOMEN - 2 VIEW COMPARISON:  11/03/2015, CT of the abdomen pelvis. FINDINGS: There is mild prominence of the colon, partly air-filled, with air-fluid levels. Several air-fluid levels are noted in nondilated small bowel. There is no evidence of obstruction. Findings are most consistent with an adynamic ileus. No free air. Soft tissues are unremarkable. Bony structures are unremarkable. IMPRESSION: Mild gaseous distention of the colon with colonic and small bowel air-fluid levels, findings consistent with a diffuse adynamic ileus. No evidence of obstruction. No free air. Electronically Signed   By: Amie Portlandavid  Ormond M.D.   On: 11/03/2015 14:16   Dg C-arm 1-60 Min-no Report  11/02/2015  CLINICAL DATA: ERCP C-ARM 1-60 MINUTES Fluoroscopy was utilized by the requesting physician.  No radiographic interpretation.    Anti-infectives: Anti-infectives    Start     Dose/Rate Route Frequency Ordered Stop   10/31/15 1345  metroNIDAZOLE (FLAGYL) IVPB 500 mg     500 mg 100 mL/hr over 60 Minutes Intravenous Every 6 hours 10/31/15 1341     10/30/15 1515  cefTRIAXone (ROCEPHIN) 1 g in dextrose 5 % 50 mL IVPB     1 g 100 mL/hr over 30 Minutes Intravenous Every 24 hours 10/30/15 1500        Assessment/Plan:  Biliary pancreatitis. Lipase not checked in 2 days but was 182 on December 12. I  believe the patient has improved considerably and could be considered for laparoscopic cholecystectomy with cholangiography as early as tomorrow. Discussed this with the patient and we will review with her the options of surgery as early as tomorrow.  Lattie Hawichard E Lismary Kiehn, MD, FACS  11/04/2015

## 2015-11-04 NOTE — Progress Notes (Signed)
I have reviewed the patient's studies and believe that she is a good candidate for laparoscopic cholecystectomy with cholangiography at this point. This is discussed with she and her family. The rationale for offering surgery at this point was reviewed and the risks of bleeding infection recurrence of symptoms failure to resolve her symptoms conversion to an open procedure bile duct damage bile duct leak retained common bile duct stone all was reviewed with her she understood and agreed to proceed

## 2015-11-04 NOTE — Progress Notes (Signed)
[facility] - [place] at [facility]   PATIENT NAME: Mallory Shields    MR#:  [id number]  DATE OF BIRTH:  02-21-1996  SUBJECTIVE:  CHIEF COMPLAINT:   Chief Complaint  Patient presents with  . Abdominal Pain    Pain is still there, but better now - s/p ERCp 11/02/15- with some drainage of bile but no stone. LFTs are better. She said she had a few loose stools overnight. Tolerating liquid diet.  REVIEW OF SYSTEMS:   CONSTITUTIONAL: No fever, generalized weakness.  EYES: No blurred or double vision.  EARS, NOSE, AND THROAT: No tinnitus or ear pain.  RESPIRATORY: No cough, shortness of breath, wheezing or hemoptysis.  CARDIOVASCULAR: No chest pain, orthopnea, edema.  GASTROINTESTINAL: Has nausea, no vomiting, diarrhea and have mild- moderate abdominal pain.  had a few loose stools overnight. GENITOURINARY: No dysuria, hematuria. C/o dark urine. ENDOCRINE: No polyuria, nocturia. HEMATOLOGY: No anemia, easy bruising or bleeding SKIN: No rash or lesion. MUSCULOSKELETAL: No joint pain or arthritis.  NEUROLOGIC: No tingling, numbness, weakness.  PSYCHIATRY: No anxiety or depression.much happy today- as the pain improved, and she tolerated liquid diet.  ROS  DRUG ALLERGIES:  No Known Allergies  VITALS:  Blood pressure 105/60, pulse 119, temperature 98.6 F (37 C), temperature source Oral, resp. rate 19, height  (1.626 m), weight 81.194 kg (179 lb), SpO2 95 %, currently breastfeeding.  PHYSICAL EXAMINATION:   GENERAL: 19 y.o.-year-old patient lying in the bed with no acute distress. Obese. EYES: Pupils equal, round, reactive to light and accommodation. No scleral icterus. Extraocular muscles intact.  HEENT: Head atraumatic, normocephalic. Oropharynx and nasopharynx clear.  NECK: Supple, no jugular venous distention. No thyroid enlargement, no tenderness.  LUNGS: Normal breath sounds bilaterally, no wheezing, rales,rhonchi or crepitation.  No use of accessory muscles of respiration.  CARDIOVASCULAR: S1, S2 normal. No murmurs, rubs, or gallops.  ABDOMEN: Soft, tender, epigastric tenderness. Bowel sounds sluggish. No organomegaly or mass. Negative Murphy's sign. EXTREMITIES: No pedal edema, cyanosis, or clubbing.  NEUROLOGIC: Cranial nerves II through XII are intact. Muscle strength 5/5 in all extremities. Sensation intact. Gait not checked.  PSYCHIATRIC: The patient is alert and oriented x 3.  SKIN: No obvious rash, lesion, or ulcer.  Physical Exam LABORATORY PANEL:   CBC  Recent Labs Lab 11/04/15 0014  WBC 17.3*  HGB 9.2*  HCT 29.5*  PLT 234   ------------------------------------------------------------------------------------------------------------------  Chemistries   Recent Labs Lab 10/30/15 1108  11/04/15 0014  NA 138  < > 137  K 4.5  < > 3.3*  CL 105  < > 110  CO2 24  < > 20*  GLUCOSE 181*  < > 76  BUN 7  < > <5*  CREATININE 0.41*  < > <0.30*  CALCIUM 9.7  < > 7.5*  MG 1.8  --   --   AST 789*  < > 21  ALT 633*  < > 73*  ALKPHOS 170*  < > 82  BILITOT 4.0*  < > 0.8  < > = values in this interval not displayed. ------------------------------------------------------------------------------------------------------------------  Cardiac Enzymes No results for input(s): TROPONINI in the last 168 hours. ------------------------------------------------------------------------------------------------------------------  RADIOLOGY:  Nm Hepatobiliary Liver Func  11/03/2015  CLINICAL DATA:  Biliary/gallstone pancreatitis, abdominal pain since 10/29/2015, nausea since yesterday EXAM: NUCLEAR MEDICINE HEPATOBILIARY IMAGING TECHNIQUE: Sequential images of the abdomen were obtained out to 60 minutes following intravenous administration of radiopharmaceutical. RADIOPHARMACEUTICALS:  5.07 mCi Tc-31m Choletec IV COMPARISON:  None ;  correlation CT abdomen 11/03/2015, ultrasound abdomen 10/30/2015 FINDINGS: Normal  uptake, concentration and excretion of tracer. Gallbladder visualized at 38 minutes. Small bowel visualized at 9 minutes. No focal hepatic retention of tracer. IMPRESSION: Patent biliary tree. Electronically Signed   By: Ulyses Southward M.D.   On: 11/03/2015 15:28   Ct Abdomen Pelvis W Contrast  11/03/2015  CLINICAL DATA:  Persistent abdominal pain, acute pancreatitis EXAM: CT ABDOMEN AND PELVIS WITH CONTRAST TECHNIQUE: Multidetector CT imaging of the abdomen and pelvis was performed using the standard protocol following bolus administration of intravenous contrast. CONTRAST:  85mL OMNIPAQUE IOHEXOL 350 MG/ML SOLN COMPARISON:  10/31/2015 FINDINGS: Lower chest: Enlarging non loculated pleural effusions with increased bibasilar collapse/ consolidation, worse in the left lower lobe. Normal heart size. No pericardial effusion. Hepatobiliary: No focal hepatic abnormality or biliary dilatation. Gallbladder contains contrast and an air-fluid level in the fundus without significant distention. No biliary dilatation. Pancreas: Similar pattern of diffuse peri pancreatic strandy edema and free fluid. Stable small focus of nonenhancement within the pancreas body. Free fluid and edema again present throughout the mesenteric and the pericolic gutters bilaterally. similar amount of dependent pelvic free fluid. No significant interval change. No pancreatic duct dilatation. Spleen: Within normal limits in size and appearance. Adrenals/Urinary Tract: No masses identified. No evidence of hydronephrosis. Stomach/Bowel: Negative bowel obstruction, significant dilatation, ileus, or free air. Normal appendix demonstrated. Vascular/Lymphatic: No pathologically enlarged lymph nodes. No evidence of abdominal aortic aneurysm. Reproductive: Small 2.6 cm left ovarian cyst present, image 80. Uterus and adnexa otherwise unremarkable. Other: No inguinal abnormality or hernia. Slight increased diffuse body wall/ flank edema compatible with  anasarca. Abdominal injection sites noted anteriorly. Musculoskeletal:  No acute osseous finding. IMPRESSION: Stable appearance of the moderate to severe acute pancreatitis with diffuse peripancreatic and mesenteric edema with retroperitoneal, abdominal, and pelvic free fluid. No other complicating feature or significant interval change. Vicarious contrast excretion within the gallbladder. Air within the fundus of the gallbladder as well, nonspecific. Stable small 2.6 cm left ovarian cyst Enlarging non loculated pleural effusions with increased bibasilar collapse/consolidation Electronically Signed   By: Judie Petit.  Shick M.D.   On: 11/03/2015 14:22   Dg Abd 2 Views  11/03/2015  CLINICAL DATA:  Pancreatitis. EXAM: ABDOMEN - 2 VIEW COMPARISON:  11/03/2015, CT of the abdomen pelvis. FINDINGS: There is mild prominence of the colon, partly air-filled, with air-fluid levels. Several air-fluid levels are noted in nondilated small bowel. There is no evidence of obstruction. Findings are most consistent with an adynamic ileus. No free air. Soft tissues are unremarkable. Bony structures are unremarkable. IMPRESSION: Mild gaseous distention of the colon with colonic and small bowel air-fluid levels, findings consistent with a diffuse adynamic ileus. No evidence of obstruction. No free air. Electronically Signed   By: Amie Portland M.D.   On: 11/03/2015 14:16   Dg C-arm 1-60 Min-no Report  11/02/2015  CLINICAL DATA: ERCP C-ARM 1-60 MINUTES Fluoroscopy was utilized by the requesting physician.  No radiographic interpretation.    ASSESSMENT AND PLAN:   Principal Problem:   Acute pancreatitis Active Problems:   Gallstone   Pancreatitis, acute   Right upper quadrant abdominal pain   Pancreatitis, gallstone   * acute pancreatitis- gall stone.   possible bile duct stone,   LFTs slightly better    Appreciated help of surgery and GI services.   Dr. Shelle Iron called GI oncall at [facility] on 10/31/15- and they suggested  non intervention- no need for ERCP at that time.  Checked for Lipid panel.    Cont pain meds and IV fluids.   As per MRCP= sludge and some small stones, but no impected stone seen in CBD.    ERCP done 12/121/6- showed no stone or sludge, sphinterotomy done- removed some bile.     No BM for 2-3 days- no obstruction per KUB per X ray 12/12- but repeated Xray on 11/03/15 have Ileus.    Repeated CT abd with contrast- no concern for abscess.   Had some loose stools overnight- will repeat Xray KUB for ileus.   Surgery is planning for cholecystectomy.   On IV fluids, Flagyl and Ceftriaxone.  * UTI    on rocephin IV.   No growth on Culture Urine.  urine culture negative .  * tachycardia   Likely due to pain, monitor.  * tachycardia   Due to pain.   Started on metoprolol.  * leukocytosis   Due to acute pancreatitis, monitor.  All the records are reviewed and case discussed with Care Management/Social Workerr. Management plans discussed with the patient, family and they are in agreement.  CODE STATUS: full.  TOTAL TIME TAKING CARE OF THIS PATIENT: 35 minutes.   POSSIBLE D/C IN 2-3 DAYS, DEPENDING ON CLINICAL CONDITION.  Altamese DillingVACHHANI, Mandi Mattioli M.D on 11/04/2015   Between 7am to 6pm - Pager - [phone]  After 6pm go to [email or web address] - password [email or web address] [facility]  [facility]  Office  [phone]  CC: Primary care physician; No primary care provider on file.  Note: This dictation was prepared with Dragon dictation along with smaller phrase technology. Any transcriptional errors that result from this process are unintentional.

## 2015-11-05 ENCOUNTER — Encounter
Admission: EM | Disposition: A | Discharge status: Home / Self Care | Payer: Self-pay | Source: Home / Self Care | Attending: Internal Medicine

## 2015-11-05 ENCOUNTER — Inpatient Hospital Stay: Payer: Medicaid Other | Admitting: Anesthesiology

## 2015-11-05 ENCOUNTER — Encounter: Payer: Self-pay | Admitting: *Deleted

## 2015-11-05 ENCOUNTER — Inpatient Hospital Stay: Payer: Medicaid Other

## 2015-11-05 HISTORY — PX: CHOLECYSTECTOMY: SHX55

## 2015-11-05 LAB — CBC WITH DIFFERENTIAL/PLATELET
BASOS PCT: 0 %
Basophils Absolute: 0 10*3/uL (ref 0–0.1)
EOS ABS: 0.3 10*3/uL (ref 0–0.7)
Eosinophils Relative: 2 %
HCT: 27.3 % — ABNORMAL LOW (ref 35.0–47.0)
Hemoglobin: 9 g/dL — ABNORMAL LOW (ref 12.0–16.0)
Lymphocytes Relative: 10 %
Lymphs Abs: 1.5 10*3/uL (ref 1.0–3.6)
MCH: 25.5 pg — ABNORMAL LOW (ref 26.0–34.0)
MCHC: 32.9 g/dL (ref 32.0–36.0)
MCV: 77.4 fL — ABNORMAL LOW (ref 80.0–100.0)
MONO ABS: 1.7 10*3/uL — AB (ref 0.2–0.9)
MONOS PCT: 11 %
Neutro Abs: 12 10*3/uL — ABNORMAL HIGH (ref 1.4–6.5)
Neutrophils Relative %: 77 %
Platelets: 266 10*3/uL (ref 150–440)
RBC: 3.52 MIL/uL — ABNORMAL LOW (ref 3.80–5.20)
RDW: 19.7 % — AB (ref 11.5–14.5)
WBC: 15.6 10*3/uL — ABNORMAL HIGH (ref 3.6–11.0)

## 2015-11-05 LAB — SURGICAL PCR SCREEN
MRSA, PCR: NEGATIVE
STAPHYLOCOCCUS AUREUS: NEGATIVE

## 2015-11-05 LAB — COMPREHENSIVE METABOLIC PANEL
ALBUMIN: 2.1 g/dL — AB (ref 3.5–5.0)
ALT: 49 U/L (ref 14–54)
ANION GAP: 6 (ref 5–15)
AST: 14 U/L — ABNORMAL LOW (ref 15–41)
Alkaline Phosphatase: 74 U/L (ref 38–126)
BILIRUBIN TOTAL: 0.6 mg/dL (ref 0.3–1.2)
BUN: <5 mg/dL — ABNORMAL LOW (ref 6–20)
CO2: 22 mmol/L (ref 22–32)
Calcium: 7.5 mg/dL — ABNORMAL LOW (ref 8.9–10.3)
Chloride: 104 mmol/L (ref 101–111)
Creatinine, Ser: 0.31 mg/dL — ABNORMAL LOW (ref 0.44–1.00)
GFR calc Af Amer: >60 mL/min (ref >60)
GFR calc non Af Amer: >60 mL/min (ref >60)
GLUCOSE: 103 mg/dL — AB (ref 65–99)
POTASSIUM: 3.1 mmol/L — AB (ref 3.5–5.1)
SODIUM: 132 mmol/L — AB (ref 135–145)
TOTAL PROTEIN: 5.2 g/dL — AB (ref 6.5–8.1)

## 2015-11-05 LAB — LIPASE, BLOOD: Lipase: 115 U/L — ABNORMAL HIGH (ref 11–51)

## 2015-11-05 LAB — MAGNESIUM: MAGNESIUM: 1.7 mg/dL (ref 1.7–2.4)

## 2015-11-05 SURGERY — LAPAROSCOPIC CHOLECYSTECTOMY WITH INTRAOPERATIVE CHOLANGIOGRAM
Anesthesia: General | Wound class: Clean

## 2015-11-05 MED ORDER — FENTANYL CITRATE (PF) 100 MCG/2ML IJ SOLN
INTRAMUSCULAR | Status: AC
  Administered 2015-11-05: 25 ug via INTRAVENOUS
  Filled 2015-11-05: qty 2

## 2015-11-05 MED ORDER — GLYCOPYRROLATE 0.2 MG/ML IJ SOLN
INTRAMUSCULAR | Status: DC | PRN
  Administered 2015-11-05: .6 mg via INTRAVENOUS

## 2015-11-05 MED ORDER — CIPROFLOXACIN IN D5W 400 MG/200ML IV SOLN
INTRAVENOUS | Status: AC
  Administered 2015-11-05: 13:00:00
  Filled 2015-11-05: qty 200

## 2015-11-05 MED ORDER — ROCURONIUM BROMIDE 100 MG/10ML IV SOLN
INTRAVENOUS | Status: DC | PRN
  Administered 2015-11-05: 5 mg via INTRAVENOUS
  Administered 2015-11-05: 40 mg via INTRAVENOUS
  Administered 2015-11-05: 5 mg via INTRAVENOUS

## 2015-11-05 MED ORDER — FENTANYL CITRATE (PF) 100 MCG/2ML IJ SOLN
INTRAMUSCULAR | Status: DC | PRN
  Administered 2015-11-05 (×4): 50 ug via INTRAVENOUS

## 2015-11-05 MED ORDER — MIDAZOLAM HCL 2 MG/2ML IJ SOLN
INTRAMUSCULAR | Status: DC | PRN
  Administered 2015-11-05: 2 mg via INTRAVENOUS

## 2015-11-05 MED ORDER — BUPIVACAINE-EPINEPHRINE (PF) 0.25% -1:200000 IJ SOLN
INTRAMUSCULAR | Status: DC | PRN
  Administered 2015-11-05: 30 mL via PERINEURAL

## 2015-11-05 MED ORDER — OXYCODONE HCL 5 MG/5ML PO SOLN: 5.0000 mg | Freq: Once | ORAL | Status: DC | PRN

## 2015-11-05 MED ORDER — FENTANYL CITRATE (PF) 100 MCG/2ML IJ SOLN
25.0000 ug | INTRAMUSCULAR | Status: AC | PRN
  Administered 2015-11-05 (×6): 25 ug via INTRAVENOUS

## 2015-11-05 MED ORDER — BUPIVACAINE-EPINEPHRINE (PF) 0.25% -1:200000 IJ SOLN
INTRAMUSCULAR | Status: AC
Start: 2015-11-05 — End: 2015-11-05
  Filled 2015-11-05: qty 30

## 2015-11-05 MED ORDER — PHENYLEPHRINE HCL 10 MG/ML IJ SOLN
INTRAMUSCULAR | Status: DC | PRN
  Administered 2015-11-05: 50 ug via INTRAVENOUS

## 2015-11-05 MED ORDER — LACTATED RINGERS IV SOLN
Freq: Once | INTRAVENOUS | Status: AC
  Administered 2015-11-05: 11:00:00 via INTRAVENOUS

## 2015-11-05 MED ORDER — OXYCODONE HCL 5 MG PO TABS: 5.0000 mg | ORAL_TABLET | Freq: Once | ORAL | Status: DC | PRN

## 2015-11-05 MED ORDER — LIDOCAINE HCL (CARDIAC) 20 MG/ML IV SOLN
INTRAVENOUS | Status: DC | PRN
  Administered 2015-11-05: 60 mg via INTRAVENOUS

## 2015-11-05 MED ORDER — DEXAMETHASONE SODIUM PHOSPHATE 10 MG/ML IJ SOLN
INTRAMUSCULAR | Status: DC | PRN
  Administered 2015-11-05: 10 mg via INTRAVENOUS

## 2015-11-05 MED ORDER — PROPOFOL 10 MG/ML IV BOLUS
INTRAVENOUS | Status: DC | PRN
  Administered 2015-11-05: 150 mg via INTRAVENOUS

## 2015-11-05 MED ORDER — POTASSIUM CHLORIDE CRYS ER 20 MEQ PO TBCR
40.0000 meq | EXTENDED_RELEASE_TABLET | Freq: Two times a day (BID) | ORAL | Status: DC
  Administered 2015-11-05 – 2015-11-06 (×2): 40 meq via ORAL
  Filled 2015-11-05 (×2): qty 2

## 2015-11-05 MED ORDER — KETOROLAC TROMETHAMINE 30 MG/ML IJ SOLN
INTRAMUSCULAR | Status: DC | PRN
  Administered 2015-11-05: 30 mg via INTRAVENOUS

## 2015-11-05 MED ORDER — LACTATED RINGERS IV SOLN
INTRAVENOUS | Status: DC | PRN
Start: 2015-11-05 — End: 2015-11-05
  Administered 2015-11-05: 12:00:00 via INTRAVENOUS

## 2015-11-05 MED ORDER — DEXMEDETOMIDINE HCL IN NACL 200 MCG/50ML IV SOLN
INTRAVENOUS | Status: DC | PRN
  Administered 2015-11-05: 8 ug via INTRAVENOUS

## 2015-11-05 MED ORDER — ALBUTEROL SULFATE (2.5 MG/3ML) 0.083% IN NEBU: 2.5000 mg | INHALATION_SOLUTION | Freq: Once | RESPIRATORY_TRACT | Status: DC | PRN

## 2015-11-05 MED ORDER — IOTHALAMATE MEGLUMINE 60 % INJ SOLN
INTRAMUSCULAR | Status: DC | PRN
  Administered 2015-11-05: 19 mL via SURGICAL_CAVITY

## 2015-11-05 MED ORDER — NEOSTIGMINE METHYLSULFATE 10 MG/10ML IV SOLN
INTRAVENOUS | Status: DC | PRN
  Administered 2015-11-05: 3.5 mg via INTRAVENOUS

## 2015-11-05 SURGICAL SUPPLY — 43 items
ADHESIVE MASTISOL STRL (MISCELLANEOUS) ×3 IMPLANT
APPLIER CLIP ROT 10 11.4 M/L (STAPLE) ×3
BLADE SURG SZ11 CARB STEEL (BLADE) ×3 IMPLANT
CANISTER SUCT 1200ML W/VALVE (MISCELLANEOUS) ×3 IMPLANT
CATH CHOLANGI 4FR 420404F (CATHETERS) ×3 IMPLANT
CHLORAPREP W/TINT 26ML (MISCELLANEOUS) ×3 IMPLANT
CLIP APPLIE ROT 10 11.4 M/L (STAPLE) ×1 IMPLANT
CLOSURE WOUND 1/2 X4 (GAUZE/BANDAGES/DRESSINGS) ×1
CONRAY 60ML FOR OR (MISCELLANEOUS) ×3 IMPLANT
DRAPE C-ARM XRAY 36X54 (DRAPES) ×3 IMPLANT
ENDOPOUCH RETRIEVER 10 (MISCELLANEOUS) ×3 IMPLANT
GAUZE SPONGE NON-WVN 2X2 STRL (MISCELLANEOUS) ×4 IMPLANT
GLOVE BIO SURGEON STRL SZ8 (GLOVE) ×3 IMPLANT
GOWN STRL REUS W/ TWL LRG LVL3 (GOWN DISPOSABLE) ×4 IMPLANT
GOWN STRL REUS W/TWL LRG LVL3 (GOWN DISPOSABLE) ×8
IRRIGATION STRYKERFLOW (MISCELLANEOUS) ×1 IMPLANT
IRRIGATOR STRYKERFLOW (MISCELLANEOUS) ×3
IV CATH ANGIO 12GX3 LT BLUE (NEEDLE) ×3 IMPLANT
IV NS 1000ML (IV SOLUTION) ×2
IV NS 1000ML BAXH (IV SOLUTION) ×1 IMPLANT
JACKSON PRATT 10 (INSTRUMENTS) ×3 IMPLANT
KIT RM TURNOVER STRD PROC AR (KITS) ×3 IMPLANT
LABEL OR SOLS (LABEL) ×3 IMPLANT
NDL SAFETY 22GX1.5 (NEEDLE) ×3 IMPLANT
NEEDLE VERESS 14GA 120MM (NEEDLE) ×3 IMPLANT
NS IRRIG 500ML POUR BTL (IV SOLUTION) ×3 IMPLANT
PACK LAP CHOLECYSTECTOMY (MISCELLANEOUS) ×3 IMPLANT
PAD GROUND ADULT SPLIT (MISCELLANEOUS) ×3 IMPLANT
SCISSORS METZENBAUM CVD 33 (INSTRUMENTS) ×3 IMPLANT
SLEEVE ENDOPATH XCEL 5M (ENDOMECHANICALS) ×3 IMPLANT
SPONGE EXCIL AMD DRAIN 4X4 6P (MISCELLANEOUS) ×3 IMPLANT
SPONGE LAP 18X18 5 PK (GAUZE/BANDAGES/DRESSINGS) ×3 IMPLANT
SPONGE VERSALON 2X2 STRL (MISCELLANEOUS) ×8
STRIP CLOSURE SKIN 1/2X4 (GAUZE/BANDAGES/DRESSINGS) ×2 IMPLANT
SUT ETHILON 3-0 KS 30 BLK (SUTURE) ×3 IMPLANT
SUT MNCRL 4-0 (SUTURE) ×2
SUT MNCRL 4-0 27XMFL (SUTURE) ×1
SUT VICRYL 0 AB UR-6 (SUTURE) ×3 IMPLANT
SUTURE MNCRL 4-0 27XMF (SUTURE) ×1 IMPLANT
SYR 20CC LL (SYRINGE) ×3 IMPLANT
TROCAR XCEL NON-BLD 11X100MML (ENDOMECHANICALS) ×3 IMPLANT
TROCAR XCEL NON-BLD 5MMX100MML (ENDOMECHANICALS) ×6 IMPLANT
TUBING INSUFFLATOR HI FLOW (MISCELLANEOUS) ×3 IMPLANT

## 2015-11-05 NOTE — Progress Notes (Addendum)
[facility] - [place] at [facility]   PATIENT NAME: Mallory FieldSandy Alejo Shields    MR#:  [id number]  DATE OF BIRTH:  09/25/1996  SUBJECTIVE:  CHIEF COMPLAINT:   Chief Complaint  Patient presents with  . Abdominal Pain    Pain is still there, but better now - s/p ERCp 11/02/15- with some drainage of bile but no stone. LFTs are better.   s/p cholecystectomy today. Tolerating liquid diet yesterday.  REVIEW OF SYSTEMS:   CONSTITUTIONAL: No fever, generalized weakness.  EYES: No blurred or double vision.  EARS, NOSE, AND THROAT: No tinnitus or ear pain.  RESPIRATORY: No cough, shortness of breath, wheezing or hemoptysis.  CARDIOVASCULAR: No chest pain, orthopnea, edema.  GASTROINTESTINAL: Has nausea, no vomiting, diarrhea and have mild- moderate abdominal pain.  had a few loose stools overnight. GENITOURINARY: No dysuria, hematuria. C/o dark urine. ENDOCRINE: No polyuria, nocturia. HEMATOLOGY: No anemia, easy bruising or bleeding SKIN: No rash or lesion. MUSCULOSKELETAL: No joint pain or arthritis.  NEUROLOGIC: No tingling, numbness, weakness.  PSYCHIATRY: No anxiety or depression.much happy today- as the pain improved, and she tolerated liquid diet.  ROS  DRUG ALLERGIES:  No Known Allergies  VITALS:  Blood pressure 107/58, pulse 82, temperature 98 F (36.7 C), temperature source Oral, resp. rate 17, height 5\' 4"  (1.626 m), weight 81.194 kg (179 lb), SpO2 95 %, currently breastfeeding.  PHYSICAL EXAMINATION:   GENERAL: 19 y.o.-year-old patient lying in the bed with no acute distress. Obese. EYES: Pupils equal, round, reactive to light and accommodation. No scleral icterus. Extraocular muscles intact.  HEENT: Head atraumatic, normocephalic. Oropharynx and nasopharynx clear.  NECK: Supple, no jugular venous distention. No thyroid enlargement, no tenderness.  LUNGS: Normal breath sounds bilaterally, no wheezing, rales,rhonchi or crepitation. No use  of accessory muscles of respiration.  CARDIOVASCULAR: S1, S2 normal. No murmurs, rubs, or gallops.  ABDOMEN: Soft, tender, epigastric tenderness. Bowel sounds sluggish. No organomegaly or mass. Negative Murphy's sign. EXTREMITIES: No pedal edema, cyanosis, or clubbing.  NEUROLOGIC: Cranial nerves II through XII are intact. Muscle strength 5/5 in all extremities. Sensation intact. Gait not checked.  PSYCHIATRIC: The patient is alert and oriented x 3.  SKIN: No obvious rash, lesion, or ulcer.  Physical Exam LABORATORY PANEL:   CBC  Recent Labs Lab 11/05/15 0503  WBC 15.6*  HGB 9.0*  HCT 27.3*  PLT 266   ------------------------------------------------------------------------------------------------------------------  Chemistries   Recent Labs Lab 11/05/15 0503  NA 132*  K 3.1*  CL 104  CO2 22  GLUCOSE 103*  BUN <5*  CREATININE 0.31*  CALCIUM 7.5*  MG 1.7  AST 14*  ALT 49  ALKPHOS 74  BILITOT 0.6   ------------------------------------------------------------------------------------------------------------------  Cardiac Enzymes No results for input(s): TROPONINI in the last 168 hours. ------------------------------------------------------------------------------------------------------------------  RADIOLOGY:  Dg Abd 1 View  11/04/2015  CLINICAL DATA:  Ileus.  Recent ERCP. EXAM: ABDOMEN - 1 VIEW COMPARISON:  11/03/2015 FINDINGS: Limited evaluation for free air on these images. There is gas within the stomach, small bowel and colon. The degree of gaseous distension is essentially unchanged. A small amount of gas in the rectal region. No large abdominal calcifications. IMPRESSION: Persistent gas-filled loops of small and large bowel. Findings have not significantly changed. Findings could be associated with an adynamic ileus. Electronically Signed   By: Richarda OverlieAdam  Henn M.D.   On: 11/04/2015 12:28   Dg Cholangiogram Operative  11/05/2015  CLINICAL DATA:  19 year old  female with a history of cholecystitis EXAM: INTRAOPERATIVE CHOLANGIOGRAM  TECHNIQUE: Cholangiographic images from the C-arm fluoroscopic device were submitted for interpretation post-operatively. Please see the procedural report for the amount of contrast and the fluoroscopy time utilized. COMPARISON:  11/03/2015 FINDINGS: Surgical instruments project over the upper abdomen. There is cannulation of the cystic duct/gallbladder neck, with antegrade infusion of contrast. Caliber of the extrahepatic ductal system within normal limits. No large filling defect identified. Free flow of contrast across the ampulla. IMPRESSION: Intraoperative cholangiogram demonstrates extrahepatic biliary ducts of unremarkable caliber, with no large filling defect identified. Free flow of contrast across the ampulla. Please refer to the dictated operative report for full details of intraoperative findings and procedure Signed, Yvone Neu. Loreta Ave, DO [facility] Electronically Signed   By: Gilmer Mor D.O.   On: 11/05/2015 14:08    ASSESSMENT AND PLAN:   Principal Problem:   Acute pancreatitis Active Problems:   Gallstone   Pancreatitis, acute   Right upper quadrant abdominal pain   Pancreatitis, gallstone   * acute pancreatitis- gall stone.   possible bile duct stone,   LFTs gradually improved to normal.    Appreciated help of surgery and GI services.   Dr. Shelle Iron called GI oncall at [facility] on 10/31/15- and they suggested non intervention- no need for ERCP at that time.    Checked for Lipid panel.    Cont pain meds and IV fluids.   As per MRCP sludge and some small stones, but no impected stone seen in CBD.    ERCP done 11/02/15- showed no stone or sludge, sphinterotomy done- removed some bile.     No BM for 2-3 days- no obstruction per KUB per X ray 12/12- but repeated Xray on 11/03/15 have Ileus.    Repeated CT abd with contrast- no concern for abscess.     On IV fluids, Flagyl and Ceftriaxone.    Cholecystectomy done 11/05/15.   Overall- manage per surgery now- whenever she can be on regular diet- will discharge her.  * UTI   on rocephin IV.   No growth on Culture Urine.   urine culture negative .  * tachycardia   Likely due to pain, monitor.  * tachycardia   Due to pain.   Started on metoprolol.  * leukocytosis   Due to acute pancreatitis, monitor.  * hypokalemia- replacing - recheck tomorrow.  All the records are reviewed and case discussed with Care Management/Social Workerr. Management plans discussed with the patient, family and they are in agreement.  CODE STATUS: full.  TOTAL TIME TAKING CARE OF THIS PATIENT: 30 minutes.   POSSIBLE D/C IN 2-3 DAYS, DEPENDING ON CLINICAL CONDITION.  Altamese Dilling M.D on 11/05/2015   Between 7am to 6pm - Pager - [phone]  After 6pm go to [email or web address] - password [email or web address] [facility]  [place] [facility]  Office  [phone]  CC: Primary care physician; No primary care provider on file.  Note: This dictation was prepared with Dragon dictation along with smaller phrase technology. Any transcriptional errors that result from this process are unintentional.

## 2015-11-05 NOTE — Anesthesia Preprocedure Evaluation (Signed)
Anesthesia Evaluation  Patient identified by MRN, date of birth, ID band Patient awake    Reviewed: Allergy & Precautions, H&P , NPO status , Patient's Chart, lab work & pertinent test results  Airway Mallampati: II  TM Distance: >3 FB Neck ROM: full    Dental no notable dental hx. (+) Teeth Intact   Pulmonary neg pulmonary ROS, neg shortness of breath,    Pulmonary exam normal breath sounds clear to auscultation       Cardiovascular Exercise Tolerance: Good (-) angina(-) Past MI and (-) DOE negative cardio ROS Normal cardiovascular exam Rhythm:regular Rate:Normal     Neuro/Psych negative neurological ROS  negative psych ROS   GI/Hepatic negative GI ROS, Neg liver ROS, neg GERD  ,  Endo/Other  negative endocrine ROS  Renal/GU negative Renal ROS  negative genitourinary   Musculoskeletal   Abdominal   Peds  Hematology negative hematology ROS (+)   Anesthesia Other Findings severe gallstone pancreatitis   Patient is NPO appropriate, and reports no nausea or vomiting.  BMI    Body Mass Index   30.71 kg/m 2      Reproductive/Obstetrics negative OB ROS                             Anesthesia Physical  Anesthesia Plan  ASA: III  Anesthesia Plan: General ETT   Post-op Pain Management:    Induction:   Airway Management Planned:   Additional Equipment:   Intra-op Plan:   Post-operative Plan:   Informed Consent: I have reviewed the patients History and Physical, chart, labs and discussed the procedure including the risks, benefits and alternatives for the proposed anesthesia with the patient or authorized representative who has indicated his/her understanding and acceptance.   Dental Advisory Given  Plan Discussed with: Anesthesiologist, CRNA and Surgeon  Anesthesia Plan Comments:         Anesthesia Quick Evaluation

## 2015-11-05 NOTE — Anesthesia Postprocedure Evaluation (Signed)
Anesthesia Post Note  Patient: Mallory Shields  Procedure(s) Performed: Procedure(s) (LRB): LAPAROSCOPIC CHOLECYSTECTOMY WITH INTRAOPERATIVE CHOLANGIOGRAM (N/A)  Patient location during evaluation: PACU Anesthesia Type: General Level of consciousness: awake and alert Pain management: pain level controlled Vital Signs Assessment: post-procedure vital signs reviewed and stable Respiratory status: spontaneous breathing, nonlabored ventilation, respiratory function stable and patient connected to nasal cannula oxygen Cardiovascular status: blood pressure returned to baseline and stable Postop Assessment: no signs of nausea or vomiting Anesthetic complications: no    Last Vitals:  Filed Vitals:   11/05/15 1415 11/05/15 1430  BP: 105/66   Pulse: 89   Temp:  37.5 C  Resp: 22     Last Pain:  Filed Vitals:   11/05/15 1434  PainSc: 7                  Joseph K Piscitello

## 2015-11-05 NOTE — Progress Notes (Signed)
Preoperative Review   Patient is met in the preoperative holding area. The history is reviewed in the chart and with the patient. I personally reviewed the options and rationale as well as the risks of this procedure that have been previously discussed with the patient. All questions asked by the patient and/or family were answered to their satisfaction.  Patient agrees to proceed with this procedure at this time.  Richard E Cooper M.D. FACS  

## 2015-11-05 NOTE — Progress Notes (Signed)
Pt back from surgery. Blood noted in urine. Pt states she is probably menstrating. Blood noted from vagina. Will monitor. RN provided briefs and pads.

## 2015-11-05 NOTE — Progress Notes (Signed)
Nutrition Follow-up    INTERVENTION:   Meals and Snacks: await diet progression post surgery Medical Food Supplement Therapy: recommend addition of nutritional supplement once diet advanced Coordination of Care: if unable to advance diet upon follow-up, may benefit from nutrition support  NUTRITION DIAGNOSIS:   Inadequate oral intake related to acute illness, poor appetite as evidenced by NPO status, per patient/family report. Continues  GOAL:   Patient will meet greater than or equal to 90% of their needs  MONITOR:    (Energy Intake, Digestive System, Anthropometrics, Electrolyte/Renal Profile)  REASON FOR ASSESSMENT:   Diagnosis, Malnutrition Screening Tool    ASSESSMENT:    Pt s/p ERCP on 12/12, gallstone pancreatitis; pt out of room for surgery on visit today; per MD notes, plan is for lap cholecystectomy with cholangiography  Diet Order:  Diet NPO time specified   Energy Intake: NPO/CL day 6  Skin:  Reviewed, no issues  Last BM:  12/9   Electrolyte and Renal Profile:  Recent Labs Lab 10/30/15 1108  11/03/15 0518 11/04/15 0014 11/05/15 0503  BUN 7  < > 5* <5* <5*  CREATININE 0.41*  < > 0.37* <0.30* 0.31*  NA 138  < > 137 137 132*  K 4.5  < > 3.5 3.3* 3.1*  MG 1.8  --   --   --  1.7  < > = values in this interval not displayed. Glucose Profile: No results for input(s): GLUCAP in the last 72 hours. Protein Profile:  Recent Labs Lab 11/03/15 0518 11/04/15 0014 11/05/15 0503  ALBUMIN 2.3* 2.3* 2.1*   Lipase     Component Value Date/Time   LIPASE 115* 11/05/2015 0503    Meds: NS with KCl at 150 ml/hr, LR at 50 ml/hr  Height:   Ht Readings from Last 1 Encounters:  11/05/15 5\' 4"  (1.626 m)    Weight:   Wt Readings from Last 1 Encounters:  11/05/15 179 lb (81.194 kg)    Filed Weights   10/30/15 1035 10/30/15 1604 11/05/15 1019  Weight: 198 lb (89.812 kg) 179 lb (81.194 kg) 179 lb (81.194 kg)    BMI:  Body mass index is 30.71  kg/(m^2).  Estimated Nutritional Needs:   Kcal:  [id number] kcals (BEE 1305, 1.3 AF, 1.0-1.2 IF)   Protein:  55-66 g (1.0-1.2 g/kg)   Fluid:  1650-1925 mL (30-35 ml/kg)   HIGH Care Level  Romelle Starcherate Cleto Claggett MS, RD, LDN [phone] Pager

## 2015-11-05 NOTE — Op Note (Signed)
Laparoscopic Cholecystectomy  Pre-operative Diagnosis: Biliary pancreatitis  Post-operative Diagnosis: Same  Procedure: Laparoscopic cholecystectomy with C-arm fluoroscopic cholangiography  Surgeon: Adah Salvageichard E. Excell Seltzerooper, MD FACS  Anesthesia: Gen. with endotracheal tube  Assistant: Surgical tech  Procedure Details  The patient was seen again in the Holding Room. The benefits, complications, treatment options, and expected outcomes were discussed with the patient. The risks of bleeding, infection, recurrence of symptoms, failure to resolve symptoms, bile duct damage, bile duct leak, retained common bile duct stone, bowel injury, any of which could require further surgery and/or ERCP, stent, or papillotomy were reviewed with the patient. The likelihood of improving the patient's symptoms with return to their baseline status is good.  The patient and/or family concurred with the proposed plan, giving informed consent.  The patient was taken to Operating Room, identified as Mallory Shields and the procedure verified as Laparoscopic Cholecystectomy.  A Time Out was held and the above information confirmed.  Prior to the induction of general anesthesia, antibiotic prophylaxis was administered. VTE prophylaxis was in place. General endotracheal anesthesia was then administered and tolerated well. After the induction, the abdomen was prepped with Chloraprep and draped in the sterile fashion. The patient was positioned in the supine position.  Local anesthetic  was injected into the skin near the umbilicus and an incision made. The Veress needle was placed. Pneumoperitoneum was then created with CO2 and tolerated well without any adverse changes in the patient's vital signs. A 5mm port was placed in the periumbilical position and the abdominal cavity was explored.  Two 5-mm ports were placed in the right upper quadrant and a 12 mm epigastric port was placed all under direct vision. All skin incisions   were infiltrated with a local anesthetic agent before making the incision and placing the trocars.   The patient was positioned  in reverse Trendelenburg, tilted slightly to the patient's left.  The gallbladder was identified, the fundus grasped and retracted cephalad. Adhesions were lysed bluntly. The infundibulum was grasped and retracted laterally, exposing the peritoneum overlying the triangle of Calot. This was then divided and exposed in a blunt fashion. A critical view of the cystic duct and cystic artery was obtained.  The cystic duct was clearly identified and bluntly dissected.   The cystic duct was clipped and incised and through a separate incision and Angiocath a cholangiogram catheter was placed. C-arm fluoroscopic angiography demonstrated good flow into the duodenum through the prior papillotomy there may have been some tiny distal common bile duct stones. These would've been the of the same size noted in the cystic duct at the time of choledochotomy which were retrieved. Proximal ducts were well identified and the cystic duct had been cannulated.  The cystic duct catheter was removed the cystic duct was doubly clipped and divided the cystic artery was doubly clipped and divided and another branch of the cystic artery lateral was divided as well after clipping.  The gallbladder was taken from the gallbladder fossa in a retrograde fashion with the electrocautery. The gallbladder was removed and placed in an Endocatch bag. The liver bed was irrigated and inspected. Hemostasis was achieved with the electrocautery. Copious irrigation was utilized and was repeatedly aspirated until clear.  The gallbladder and Endocatch sac were then removed through the epigastric port site.   A 10 mm JP drain was brought in through a lateral port site and placed in the foramen Winslow and held in with 3-0 nylon.  Inspection of the right upper quadrant was  performed. No bleeding, bile duct injury or leak, or  bowel injury was noted. Pneumoperitoneum was released.  The epigastric port site was closed with figure-of-eight 0 Vicryl sutures. 4-0 subcuticular Monocryl was used to close the skin. Steristrips and Mastisol and sterile dressings were  applied.  The patient was then extubated and brought to the recovery room in stable condition. Sponge, lap, and needle counts were correct at closure and at the conclusion of the case.   Findings: Acute Cholecystitis   Estimated Blood Loss: 50 cc         Drains: JP 1         Specimens: Gallbladder           Complications: none               Mallory Hoffmann E. Excell Seltzer, MD, FACS

## 2015-11-05 NOTE — Transfer of Care (Signed)
Immediate Anesthesia Transfer of Care Note  Patient: Mallory FieldSandy Alejo Jimenez  Procedure(s) Performed: Procedure(s): LAPAROSCOPIC CHOLECYSTECTOMY WITH INTRAOPERATIVE CHOLANGIOGRAM (N/A)  Patient Location: PACU  Anesthesia Type:General  Level of Consciousness: awake, alert  and oriented  Airway & Oxygen Therapy: Patient Spontanous Breathing and Patient connected to nasal cannula oxygen  Post-op Assessment: Report given to RN and Post -op Vital signs reviewed and stable  Post vital signs: Reviewed and stable  Last Vitals:  Filed Vitals:   11/05/15 1019 11/05/15 1345  BP: 96/57   Pulse: 119   Temp: 36.7 C 38.1 C  Resp: 18     Complications: No apparent anesthesia complications

## 2015-11-05 NOTE — Progress Notes (Signed)
Patient seen in her room prior to surgery. She feels better today but wants to have cholecystectomy today. We discussed the options again and the risks of bleeding infection recurrence of symptoms retained common bile duct stone the need for a cholangiogram and the risk of bile duct or bowel injury this is all reviewed for her she understood and agreed to proceed

## 2015-11-06 LAB — BASIC METABOLIC PANEL
ANION GAP: 3 — AB (ref 5–15)
BUN: 6 mg/dL (ref 6–20)
CALCIUM: 8.2 mg/dL — AB (ref 8.9–10.3)
CHLORIDE: 111 mmol/L (ref 101–111)
CO2: 25 mmol/L (ref 22–32)
Creatinine, Ser: <0.3 mg/dL — ABNORMAL LOW (ref 0.44–1.00)
GLUCOSE: 153 mg/dL — AB (ref 65–99)
POTASSIUM: 4.5 mmol/L (ref 3.5–5.1)
Sodium: 139 mmol/L (ref 135–145)

## 2015-11-06 MED ORDER — OXYCODONE HCL 5 MG PO TABS: 5.0000 mg | ORAL_TABLET | Freq: Four times a day (QID) | ORAL | Status: DC | PRN

## 2015-11-06 MED ORDER — BOOST / RESOURCE BREEZE PO LIQD: 1.0000 | Freq: Three times a day (TID) | ORAL | Status: DC

## 2015-11-06 MED ORDER — BOOST / RESOURCE BREEZE PO LIQD
1.0000 | Freq: Three times a day (TID) | ORAL | Status: DC
  Administered 2015-11-06: 1 via ORAL

## 2015-11-06 MED ORDER — METRONIDAZOLE 250 MG PO TABS: 250.0000 mg | ORAL_TABLET | Freq: Three times a day (TID) | ORAL | Status: DC

## 2015-11-06 MED ORDER — MORPHINE SULFATE ER 15 MG PO TBCR
15.0000 mg | EXTENDED_RELEASE_TABLET | Freq: Two times a day (BID) | ORAL | Status: DC
Start: 2015-11-06 — End: 2015-11-10

## 2015-11-06 MED ORDER — CIPROFLOXACIN HCL 500 MG PO TABS: 500.0000 mg | ORAL_TABLET | Freq: Two times a day (BID) | ORAL | Status: AC

## 2015-11-06 NOTE — Progress Notes (Signed)
Pt was admitted to [facility] on 10/30/15. Please excused Walden FieldSandy Alejo Jimenez from work from 10/30/15 through 11/14/15 to allow for adequate recovery time - per Dr. Larose HiresVachhani's request.  Thank you. Deno LungerAlecia Terris Bodin BSN, RN

## 2015-11-06 NOTE — Discharge Summary (Signed)
[facility] [place] at [facility]   PATIENT NAME: Mallory Shields    MR#:  [id number]  DATE OF BIRTH:  11/18/1996  DATE OF ADMISSION:  10/30/2015 ADMITTING PHYSICIAN: Shaune Pollack, MD  DATE OF DISCHARGE: 11/06/2015  PRIMARY CARE PHYSICIAN: No primary care provider on file.    ADMISSION DIAGNOSIS:  Right upper quadrant abdominal pain [R10.11] Acute pancreatitis, unspecified pancreatitis type [K85.9]  DISCHARGE DIAGNOSIS:  Principal Problem:   Acute pancreatitis Active Problems:   Gallstone   Pancreatitis, acute   Right upper quadrant abdominal pain   Pancreatitis, gallstone    S/p ERCP and cholecystectomy.  SECONDARY DIAGNOSIS:  History reviewed. No pertinent past medical history.  HOSPITAL COURSE:   * acute pancreatitis- gall stone.  possible bile duct stone,  LFTs gradually improved to normal.  Appreciated help of surgery and GI services.  Dr. Shelle Iron called GI oncall at [facility] on 10/31/15- and they suggested non intervention- no need for ERCP at that time.  Checked for Lipid panel.  Cont pain meds and IV fluids.  As per MRCP sludge and some small stones, but no impected stone seen in CBD.  ERCP done 11/02/15- showed no stone or sludge, sphinterotomy done- removed some bile.   No BM for 2-3 days- no obstruction per KUB per X ray 12/12- but repeated Xray on 11/03/15 have Ileus.  Repeated CT abd with contrast- no concern for abscess.  On IV fluids, Flagyl and Ceftriaxone.  Cholecystectomy done 11/05/15.  Overall- manage per surgery now- whenever she can be on regular diet- will discharge her.  * UTI  on rocephin IV.  No growth on Culture Urine.  urine culture negative .  * tachycardia  Likely due to pain, monitor.  * tachycardia  Due to pain.  Started on metoprolol.  * leukocytosis  Due to acute pancreatitis, monitor.  * hypokalemia- replacing - recheck tomorrow.   DISCHARGE CONDITIONS:    Stable.  CONSULTS OBTAINED:  Treatment Team:  Lattie Haw, MD Elnita Maxwell, MD Altamese Dilling, MD  DRUG ALLERGIES:  No Known Allergies  DISCHARGE MEDICATIONS:   Current Discharge Medication List    START taking these medications   Details  ciprofloxacin (CIPRO) 500 MG tablet Take 1 tablet (500 mg total) by mouth 2 (two) times daily. Qty: 14 tablet, Refills: 0    feeding supplement (BOOST / RESOURCE BREEZE) LIQD Take 1 Container by mouth 3 (three) times daily with meals. Qty: 15 Container, Refills: 0    metroNIDAZOLE (FLAGYL) 250 MG tablet Take 1 tablet (250 mg total) by mouth 3 (three) times daily. Qty: 21 tablet, Refills: 0    morphine (MS CONTIN) 15 MG 12 hr tablet Take 1 tablet (15 mg total) by mouth every 12 (twelve) hours. Qty: 8 tablet, Refills: 0    oxyCODONE (OXY IR/ROXICODONE) 5 MG immediate release tablet Take 1 tablet (5 mg total) by mouth every 6 (six) hours as needed for severe pain. Qty: 10 tablet, Refills: 0      CONTINUE these medications which have NOT CHANGED   Details  Prenatal Vit-Fe Fumarate-FA (PRENATAL MULTIVITAMIN) TABS tablet Take 1 tablet by mouth daily at 12 noon.    benzocaine-Menthol (DERMOPLAST) 20-0.5 % AERO Apply 1 application topically as needed for irritation (perineal discomfort).    ibuprofen (ADVIL,MOTRIN) 600 MG tablet Take 1 tablet (600 mg total) by mouth every 6 (six) hours. Qty: 30 tablet, Refills: 0      STOP taking these medications  lanolin OINT          DISCHARGE INSTRUCTIONS:    Follow with surgical clinic in 2 weeks.  If you experience worsening of your admission symptoms, develop shortness of breath, life threatening emergency, suicidal or homicidal thoughts you must seek medical attention immediately by calling 911 or calling your MD immediately  if symptoms less severe.  You Must read complete instructions/literature along with all the possible adverse reactions/side effects for all  the Medicines you take and that have been prescribed to you. Take any new Medicines after you have completely understood and accept all the possible adverse reactions/side effects.   Please note  You were cared for by a hospitalist during your hospital stay. If you have any questions about your discharge medications or the care you received while you were in the hospital after you are discharged, you can call the unit and asked to speak with the hospitalist on call if the hospitalist that took care of you is not available. Once you are discharged, your primary care physician will handle any further medical issues. Please note that NO REFILLS for any discharge medications will be authorized once you are discharged, as it is imperative that you return to your primary care physician (or establish a relationship with a primary care physician if you do not have one) for your aftercare needs so that they can reassess your need for medications and monitor your lab values.    Today   CHIEF COMPLAINT:   Chief Complaint  Patient presents with  . Abdominal Pain    HISTORY OF PRESENT ILLNESS:  Mallory Shields  is a 19 y.o. female with no past medical history. She presented to the ED with abdominal pain, nausea, vomiting for the past 2 days. Abdominal pain is in epigastric area, intermittent, 8 out of 10 without radiation. The patient also has had the nausea, vomiting for the past 2 days with poor oral intake. Patient had diarrhea twice today. She denies any fever or chills but has headache and dizziness and weakness. She denies any dark urine or jaundice. Her lipase is elevated at 3469.    VITAL SIGNS:  Blood pressure 98/59, pulse 65, temperature 97.7 F (36.5 C), temperature source Oral, resp. rate 14, height 5\' 4"  (1.626 m), weight 81.194 kg (179 lb), SpO2 95 %, currently breastfeeding.  I/O:   Intake/Output Summary (Last 24 hours) at 11/06/15 1227 Last data filed at 11/06/15 1045  Gross per  24 hour  Intake 4310.5 ml  Output   2360 ml  Net 1950.5 ml    PHYSICAL EXAMINATION:   GENERAL: 19 y.o.-year-old patient lying in the bed with no acute distress. Obese. EYES: Pupils equal, round, reactive to light and accommodation. No scleral icterus. Extraocular muscles intact.  HEENT: Head atraumatic, normocephalic. Oropharynx and nasopharynx clear.  NECK: Supple, no jugular venous distention. No thyroid enlargement, no tenderness.  LUNGS: Normal breath sounds bilaterally, no wheezing, rales,rhonchi or crepitation. No use of accessory muscles of respiration.  CARDIOVASCULAR: S1, S2 normal. No murmurs, rubs, or gallops.  ABDOMEN: Soft, tender, epigastric tenderness. Bowel sounds sluggish. No organomegaly or mass. Negative Murphy's sign. EXTREMITIES: No pedal edema, cyanosis, or clubbing.  NEUROLOGIC: Cranial nerves II through XII are intact. Muscle strength 5/5 in all extremities. Sensation intact. Gait not checked.  PSYCHIATRIC: The patient is alert and oriented x 3.  SKIN: No obvious rash, lesion, or ulcer.   DATA REVIEW:   CBC  Recent Labs Lab 11/05/15 0503  WBC  15.6*  HGB 9.0*  HCT 27.3*  PLT 266    Chemistries   Recent Labs Lab 11/05/15 0503 11/06/15 0518  NA 132* 139  K 3.1* 4.5  CL 104 111  CO2 22 25  GLUCOSE 103* 153*  BUN <5* 6  CREATININE 0.31* <0.30*  CALCIUM 7.5* 8.2*  MG 1.7  --   AST 14*  --   ALT 49  --   ALKPHOS 74  --   BILITOT 0.6  --     Cardiac Enzymes No results for input(s): TROPONINI in the last 168 hours.  Microbiology Results  Results for orders placed or performed during the hospital encounter of 10/30/15  Urine culture     Status: None   Collection Time: 11/01/15  5:45 AM  Result Value Ref Range Status   Specimen Description URINE, RANDOM  Final   Special Requests NONE  Final   Culture NO GROWTH 1 DAY  Final   Report Status 11/02/2015 FINAL  Final  Culture, blood (routine x 2)     Status: None (Preliminary  result)   Collection Time: 11/03/15  8:41 PM  Result Value Ref Range Status   Specimen Description BLOOD LEFT HAND  Final   Special Requests BOTTLES DRAWN AEROBIC AND ANAEROBIC 5CC  Final   Culture NO GROWTH 2 DAYS  Final   Report Status PENDING  Incomplete  Culture, blood (routine x 2)     Status: None (Preliminary result)   Collection Time: 11/04/15 12:13 AM  Result Value Ref Range Status   Specimen Description BLOOD LEFT ANTECUBITAL  Final   Special Requests BOTTLES DRAWN AEROBIC AND ANAEROBIC 10ML  Final   Culture NO GROWTH 1 DAY  Final   Report Status PENDING  Incomplete  Surgical pcr screen     Status: None   Collection Time: 11/04/15 11:04 PM  Result Value Ref Range Status   MRSA, PCR NEGATIVE NEGATIVE Final   Staphylococcus aureus NEGATIVE NEGATIVE Final    Comment:        The Xpert SA Assay (FDA approved for NASAL specimens in patients over 20 years of age), is one component of a comprehensive surveillance program.  Test performance has been validated by [facility] for patients greater than or equal to 833 year old. It is not intended to diagnose infection nor to guide or monitor treatment.     RADIOLOGY:  Dg Cholangiogram Operative  11/05/2015  CLINICAL DATA:  19 year old female with a history of cholecystitis EXAM: INTRAOPERATIVE CHOLANGIOGRAM TECHNIQUE: Cholangiographic images from the C-arm fluoroscopic device were submitted for interpretation post-operatively. Please see the procedural report for the amount of contrast and the fluoroscopy time utilized. COMPARISON:  11/03/2015 FINDINGS: Surgical instruments project over the upper abdomen. There is cannulation of the cystic duct/gallbladder neck, with antegrade infusion of contrast. Caliber of the extrahepatic ductal system within normal limits. No large filling defect identified. Free flow of contrast across the ampulla. IMPRESSION: Intraoperative cholangiogram demonstrates extrahepatic biliary ducts of unremarkable  caliber, with no large filling defect identified. Free flow of contrast across the ampulla. Please refer to the dictated operative report for full details of intraoperative findings and procedure Signed, Yvone NeuJaime S. Loreta AveWagner, DO [facility] Electronically Signed   By: Gilmer MorJaime  Wagner D.O.   On: 11/05/2015 14:08    EKG:   Orders placed or performed in visit on 10/06/14  . EKG 12-Lead      Management plans discussed with the patient, family and they  are in agreement.  CODE STATUS:     Code Status Orders        Start     Ordered   10/30/15 1625  Full code   Continuous     10/30/15 1624      TOTAL TIME TAKING CARE OF THIS PATIENT: 35 minutes.    Altamese Dilling M.D on 11/06/2015 at 12:27 PM  Between 7am to 6pm - Pager - [phone]  After 6pm go to [email or web address] - password [email or web address] [facility]  [facility]  Office  [phone]  CC: Primary care physician; No primary care provider on file.   Note: This dictation was prepared with Dragon dictation along with smaller phrase technology. Any transcriptional errors that result from this process are unintentional.

## 2015-11-06 NOTE — Progress Notes (Signed)
Nutrition Follow-up     INTERVENTION:  Meals and snacks: Await diet progression Medical Nutrition supplement Therapy: recommend boost breeze TID for added nutrition   NUTRITION DIAGNOSIS:   Inadequate oral intake related to acute illness, poor appetite as evidenced by NPO status, per patient/family report.    GOAL:   Patient will meet greater than or equal to 90% of their needs    MONITOR:    (Energy Intake, Digestive System, Anthropometrics, Electrolyte/Renal Profile)  REASON FOR ASSESSMENT:   Diagnosis, Malnutrition Screening Tool    ASSESSMENT:      Pt s/p cholecystectomy   Current Nutrition:tolerating clear liquids.  Limited intake day 7 (NPO/CL)   Gastrointestinal Profile: Last BM: 12/15   Scheduled Medications:  . ciprofloxacin  400 mg Intravenous Q12H  . diltiazem  30 mg Oral 3 times per day  . morphine  15 mg Oral Q12H  . potassium chloride  40 mEq Oral BID    Continuous Medications:  . 0.9 % NaCl with KCl 20 mEq / L 150 mL/hr at 11/06/15 0509     Electrolyte/Renal Profile and Glucose Profile:   Recent Labs Lab 10/30/15 1108  11/04/15 0014 11/05/15 0503 11/06/15 0518  NA 138  < > 137 132* 139  K 4.5  < > 3.3* 3.1* 4.5  CL 105  < > 110 104 111  CO2 24  < > 20* 22 25  BUN 7  < > <5* <5* 6  CREATININE 0.41*  < > <0.30* 0.31* <0.30*  CALCIUM 9.7  < > 7.5* 7.5* 8.2*  MG 1.8  --   --  1.7  --   GLUCOSE 181*  < > 76 103* 153*  < > = values in this interval not displayed. Protein Profile:   Recent Labs Lab 11/03/15 0518 11/04/15 0014 11/05/15 0503  ALBUMIN 2.3* 2.3* 2.1*     Pt reports weight loss with recent birth of child, otherwise normal wt   Weight Trend since Admission: Filed Weights   10/30/15 1035 10/30/15 1604 11/05/15 1019  Weight: 198 lb (89.812 kg) 179 lb (81.194 kg) 179 lb (81.194 kg)      Diet Order:  Diet clear liquid Room service appropriate?: Yes; Fluid consistency:: Thin  Skin:  Reviewed, no  issues   Height:   Ht Readings from Last 1 Encounters:  11/05/15 5\' 4"  (1.626 m)    Weight:   Wt Readings from Last 1 Encounters:  11/05/15 179 lb (81.194 kg)    Ideal Body Weight:     BMI:  Body mass index is 30.71 kg/(m^2).  Estimated Nutritional Needs:   Kcal:  [id number] kcals (BEE 1305, 1.3 AF, 1.0-1.2 IF)   Protein:  55-66 g (1.0-1.2 g/kg)   Fluid:  1650-1925 mL (30-35 ml/kg)   EDUCATION NEEDS:   No education needs identified at this time  MODERATE Care Level  Hristopher Missildine B. Freida BusmanAllen, RD, LDN [phone] (pager)

## 2015-11-06 NOTE — Progress Notes (Signed)
Pt stable. IV removed. D/c instructions given and education provided. JP drain education and care provided. Drsg supplies given. Pt getting dressed and will be escorted out by staff. Work note will be written per MD requests by nurse.

## 2015-11-06 NOTE — Progress Notes (Signed)
1 Day Post-Op   Subjective:  Patient reports doing well this morning. States she is hungry and ready for regular diet. States her pain is much improved intolerable with her current medications.  Vital signs in last 24 hours: Temp:  [97.7 F (36.5 C)-100.6 F (38.1 C)] 97.7 F (36.5 C) (12/16 0456) Pulse Rate:  [65-111] 65 (12/16 0456) Resp:  [14-28] 14 (12/16 0456) BP: (98-108)/(56-67) 98/59 mmHg (12/16 0456) SpO2:  [92 %-97 %] 95 % (12/15 2115) Last BM Date: 11/05/15  Intake/Output from previous day: 12/15 0701 - 12/16 0700 In: 4625.5 [I.V.:4625.5] Out: 1610 [Urine:1200; Drains:410]  GI: Abdomen soft, appropriately tender to palpation at her incisions, JP in place in the right upper quadrant with serosanguineous output.  Lab Results:  CBC  Recent Labs  11/04/15 0014 11/05/15 0503  WBC 17.3* 15.6*  HGB 9.2* 9.0*  HCT 29.5* 27.3*  PLT 234 266   CMP     Component Value Date/Time   NA 139 11/06/2015 0518   NA 141 10/06/2014 0333   K 4.5 11/06/2015 0518   K 3.8 10/06/2014 0333   CL 111 11/06/2015 0518   CL 106 10/06/2014 0333   CO2 25 11/06/2015 0518   CO2 26 10/06/2014 0333   GLUCOSE 153* 11/06/2015 0518   GLUCOSE 118* 10/06/2014 0333   BUN 6 11/06/2015 0518   BUN 9 10/06/2014 0333   CREATININE <0.30* 11/06/2015 0518   CREATININE 0.68 10/06/2014 0333   CALCIUM 8.2* 11/06/2015 0518   CALCIUM 8.5* 10/06/2014 0333   PROT 5.2* 11/05/2015 0503   PROT 7.9 10/06/2014 0333   ALBUMIN 2.1* 11/05/2015 0503   ALBUMIN 4.2 10/06/2014 0333   AST 14* 11/05/2015 0503   AST 20 10/06/2014 0333   ALT 49 11/05/2015 0503   ALT 21 10/06/2014 0333   ALKPHOS 74 11/05/2015 0503   ALKPHOS 66 10/06/2014 0333   BILITOT 0.6 11/05/2015 0503   BILITOT 1.1* 10/06/2014 0333   GFRNONAA NOT CALCULATED 11/06/2015 0518   GFRNONAA >60 10/06/2014 0333   GFRAA NOT CALCULATED 11/06/2015 0518   GFRAA >60 10/06/2014 0333   PT/INR No results for input(s): LABPROT, INR in the last 72  hours.  Studies/Results: Dg Cholangiogram Operative  11/05/2015  CLINICAL DATA:  19 year old female with a history of cholecystitis EXAM: INTRAOPERATIVE CHOLANGIOGRAM TECHNIQUE: Cholangiographic images from the C-arm fluoroscopic device were submitted for interpretation post-operatively. Please see the procedural report for the amount of contrast and the fluoroscopy time utilized. COMPARISON:  11/03/2015 FINDINGS: Surgical instruments project over the upper abdomen. There is cannulation of the cystic duct/gallbladder neck, with antegrade infusion of contrast. Caliber of the extrahepatic ductal system within normal limits. No large filling defect identified. Free flow of contrast across the ampulla. IMPRESSION: Intraoperative cholangiogram demonstrates extrahepatic biliary ducts of unremarkable caliber, with no large filling defect identified. Free flow of contrast across the ampulla. Please refer to the dictated operative report for full details of intraoperative findings and procedure Signed, Yvone NeuJaime S. Loreta AveWagner, DO [facility] Electronically Signed   By: Gilmer MorJaime  Wagner D.O.   On: 11/05/2015 14:08    Assessment/Plan: 19 year old female status post laparoscopic cholecystectomy. Doing well. Anticipate discharge home on oral antibiotics. Patient only follow-up in the surgery clinic in 1 week for drain investigation and removal.   Ricarda Frameharles Kemesha Mosey, MD [facility] General Surgeon  11/06/2015

## 2015-11-06 NOTE — Plan of Care (Signed)
Problem: Skin Integrity: Goal: Demonstration of wound healing without infection will improve Outcome: Not Progressing Reinforced dsg to JP site

## 2015-11-08 LAB — CULTURE, BLOOD (ROUTINE X 2): Culture: NO GROWTH

## 2015-11-09 LAB — SURGICAL PATHOLOGY

## 2015-11-09 LAB — CULTURE, BLOOD (ROUTINE X 2): CULTURE: NO GROWTH

## 2015-11-10 ENCOUNTER — Ambulatory Visit (INDEPENDENT_AMBULATORY_CARE_PROVIDER_SITE_OTHER): Payer: Medicaid Other | Admitting: General Surgery

## 2015-11-10 VITALS — BP 110/66 | HR 88 | Temp 98.3°F | Ht 64.0 in | Wt 180.0 lb

## 2015-11-10 DIAGNOSIS — Z4889 Encounter for other specified surgical aftercare: Secondary | ICD-10-CM

## 2015-11-10 NOTE — Patient Instructions (Signed)
You will sore since your drainage is out. If you notice any redness, fever, drainage doesn't stop and the pain doesn't go away.

## 2015-11-10 NOTE — Progress Notes (Signed)
Outpatient Surgical Follow Up  11/10/2015  Mallory Shields is an 19 y.o. female.   Chief Complaint  Patient presents with  . Routine Post Op    Laparoscopic Cholecystectomy 11/05/2015 Dr. Excell Seltzerooper    HPI: 19 year old female returns to clinic 5 days postop from a cholecystectomy for drain removal. Patient states that she is been only having pain at the drain site but has not been taking any pain medications for this. She's been tolerating regular diet without any fevers, chills, nausea, vomiting. She has been having loose stools since the surgery however this appears to be improving. She is very happy with her surgical service so far. Patient brought a drain diary which shows 50 mls or less of output per day.  No past medical history on file.  Past Surgical History  Procedure Laterality Date  . Ercp N/A 11/02/2015    Procedure: ENDOSCOPIC RETROGRADE CHOLANGIOPANCREATOGRAPHY (ERCP);  Surgeon: Wallace CullensPaul Y Oh, MD;  [facility];  Service: Endoscopy;  Laterality: N/A;  . Cholecystectomy N/A 11/05/2015    Procedure: LAPAROSCOPIC CHOLECYSTECTOMY WITH INTRAOPERATIVE CHOLANGIOGRAM;  Surgeon: Lattie Hawichard E Cooper, MD;  Location: [facility] ORS;  Service: General;  Laterality: N/A;    Family History  Problem Relation Age of Onset  . Miscarriages / Stillbirths Neg Hx   . Gallstones Mother   . Gallstones Father   . Gallstones Sister     Social History:  reports that she has never smoked. She has never used smokeless tobacco. She reports that she does not drink alcohol or use illicit drugs.  Allergies: No Known Allergies  Medications reviewed.    ROS Multipoint review of systems was completed. All pertinent positives and negatives were documented within the history of present illness and the remainder were negative.   BP 110/66 mmHg  Pulse 88  Temp(Src) 98.3 F (36.8 C) (Oral)  Ht 5\' 4"  (1.626 m)  Wt 81.647 kg (180 lb)  BMI 30.88 kg/m2  Physical Exam  Gen.: No acute  distress Chest: Clear to auscultation Heart: Regular rate and rhythm Abdomen: Soft, purple tender to palpation at incision sites, non-distended. JP drain in the right upper quadrant draining a serosanguineous fluid. No evidence of bile in the drain output.   No results found for this or any previous visit (from the past 48 hour(s)). No results found.  Assessment/Plan:  1. Aftercare following surgery 19 year old female 5 days status post laparoscopic cholecystectomy. Patient has done very well. Removed drain in clinic today without difficulty or complication. Discussed appropriate showering and drain site care. Instructed to complete her current course of antibiotics and on the signs and symptoms of infection and indications for returning to clinic. Patient voiced understanding we'll follow up with clinic on an as-needed basis.     Ricarda Frameharles Alaria Oconnor, MD FACS General Surgeon  11/10/2015,9:49 AM

## 2016-01-29 ENCOUNTER — Emergency Department
Admission: EM | Admit: 2016-01-29 | Discharge: 2016-01-29 | Disposition: A | Discharge status: Home / Self Care | Payer: Medicaid Other | Attending: Student | Admitting: Student

## 2016-01-29 ENCOUNTER — Encounter: Payer: Self-pay | Admitting: Emergency Medicine

## 2016-01-29 DIAGNOSIS — J069 Acute upper respiratory infection, unspecified: Secondary | ICD-10-CM

## 2016-01-29 DIAGNOSIS — H748X3 Other specified disorders of middle ear and mastoid, bilateral: Secondary | ICD-10-CM | POA: Insufficient documentation

## 2016-01-29 LAB — POCT RAPID STREP A: STREPTOCOCCUS, GROUP A SCREEN (DIRECT): NEGATIVE

## 2016-01-29 LAB — RAPID INFLUENZA A&B ANTIGENS (ARMC ONLY)
INFLUENZA A (ARMC): NEGATIVE
INFLUENZA B (ARMC): NEGATIVE

## 2016-01-29 MED ORDER — FLUTICASONE PROPIONATE 50 MCG/ACT NA SUSP: 2.0000 | Freq: Every day | NASAL | Status: DC

## 2016-01-29 MED ORDER — MAGIC MOUTHWASH W/LIDOCAINE: 5.0000 mL | Freq: Four times a day (QID) | ORAL | Status: DC | PRN

## 2016-01-29 MED ORDER — PSEUDOEPH-BROMPHEN-DM 30-2-10 MG/5ML PO SYRP: 10.0000 mL | ORAL_SOLUTION | Freq: Four times a day (QID) | ORAL | Status: DC | PRN

## 2016-01-29 NOTE — ED Provider Notes (Signed)
[facility] Emergency Department Provider Note  ____________________________________________  Time seen: Approximately 7:50 PM  I have reviewed the triage vital signs and the nursing notes.   HISTORY  Chief Complaint Generalized Body Aches    HPI Mallory Shields is a 20 y.o. female , NAD, presents to the emergency department with 2 day history of fever, body aches, sore throat, nasal congestion, headache. Notes headache feels like sinus pressure. The headache is not the worst headache of her life. Headache does not wake her from sleep. Has taken over-the-counter ibuprofen with mild relief of pain and aches. Has not taken any other cough or cold medication. States she was exposed to a friend who had an upper respiratory infection. Denies LOC, dizziness, chest pain, shortness of breath, wheezing. Has had no abdominal pain, nausea, vomiting, diarrhea.   History reviewed. No pertinent past medical history.  Patient Active Problem List   Diagnosis Date Noted  . Aftercare following surgery 11/10/2015  . Pancreatitis, gallstone   . Acute pancreatitis 10/30/2015  . Gallstone 10/30/2015  . Pancreatitis, acute   . Right upper quadrant abdominal pain   . Postpartum care following vaginal delivery 09/18/2015    Past Surgical History  Procedure Laterality Date  . Ercp N/A 11/02/2015    Procedure: ENDOSCOPIC RETROGRADE CHOLANGIOPANCREATOGRAPHY (ERCP);  Surgeon: Wallace CullensPaul Y Oh, MD;  Location[facility] END[facility]COPY;  Service: Endoscopy;  Laterality: N/A;  . Cholecystectomy N/A 11/05/2015    Procedure: LAPAROSCOPIC CHOLECYSTECTOMY WITH INTRAOPERATIVE CHOLANGIOGRAM;  Surgeon: Lattie Hawichard E Cooper, MD;  Location: [facility] ORS;  Service: General;  Laterality: N/A;    Current Outpatient Rx  Name  Route  Sig  Dispense  Refill  . brompheniramine-pseudoephedrine-DM 30-2-10 MG/5ML syrup   Oral   Take 10 mLs by mouth 4 (four) times daily as needed.   200 mL   0   . fluticasone (FLONASE)  50 MCG/ACT nasal spray   Each Nare   Place 2 sprays into both nostrils daily.   16 g   0   . magic mouthwash w/lidocaine SOLN   Oral   Take 5 mLs by mouth 4 (four) times daily as needed for mouth pain.   240 mL   0     Please mix 80mL diphenhydramine, 80mL nystatin, 80 ...     Allergies Review of patient's allergies indicates no known allergies.  Family History  Problem Relation Age of Onset  . Miscarriages / Stillbirths Neg Hx   . Gallstones Mother   . Gallstones Father   . Gallstones Sister     Social History Social History  Substance Use Topics  . Smoking status: Never Smoker   . Smokeless tobacco: Never Used  . Alcohol Use: No     Review of Systems  Constitutional: Positive fever/chills. No fatigue. Eyes: No visual changes. No discharge, erythema, swelling. ENT: Positive nasal congestion, runny nose, sinus pressure, sore throat. No ear pain. Cardiovascular: No chest pain. Respiratory: Positive chest congestion, cough. No shortness of breath. No wheezing.  Gastrointestinal: No abdominal pain.  No nausea, vomiting.  No diarrhea.   Musculoskeletal: As of the general myalgias. Negative for neck pain.  Skin: Negative for rash. Neurological: Positive for headaches, but no focal weakness or numbness. 10-point ROS otherwise negative.  ____________________________________________   PHYSICAL EXAM:  VITAL SIGNS: ED Triage Vitals  Enc Vitals Group     BP 01/29/16 1725 113/71 mmHg     Pulse Rate 01/29/16 1725 100     Resp 01/29/16 1725 20  Temp 01/29/16 1725 98.2 F (36.8 C)     Temp Source 01/29/16 1725 Oral     SpO2 01/29/16 1725 98 %     Weight 01/29/16 1725 180 lb (81.647 kg)     Height 01/29/16 1725  (1.651 m)     Head Cir --      Peak Flow --      Pain Score 01/29/16 1738 7     Pain Loc --      Pain Edu? --      Excl. in GC? --     Constitutional: Alert and oriented. Well appearing and in no acute distress. Eyes: Conjunctivae are normal.  PERRL. EOMI without pain.  Head: Atraumatic. ENT:      Ears: TMs visualized bilaterally with mild serous effusion but no erythema, bulging, perforation.      Nose: Mild congestion with moderate clear rhinnorhea.      Mouth/Throat: Mucous membranes are moist. Pharynx with mild erythema and clear postnasal drip. No swelling or exudate noted. Neck: No stridor. Supple with full range of motion Hematological/Lymphatic/Immunilogical: No cervical lymphadenopathy. Cardiovascular: Normal rate, regular rhythm. Normal S1 and S2.  Good peripheral circulation. Respiratory: Normal respiratory effort without tachypnea or retractions. Lungs CTAB. Neurologic:  Normal speech and language. No gross focal neurologic deficits are appreciated.  Skin:  Skin is warm, dry and intact. No rash noted. Psychiatric: Mood and affect are normal. Speech and behavior are normal. Patient exhibits appropriate insight and judgement.   ____________________________________________   LABS (all labs ordered are listed, but only abnormal results are displayed)  Labs Reviewed  RAPID INFLUENZA A&B ANTIGENS ([facility] ONLY)  POCT RAPID STREP A   ____________________________________________  EKG  None ____________________________________________  RADIOLOGY  None  ____________________________________________    PROCEDURES  Procedure(s) performed: None    Medications - No data to display   ____________________________________________   INITIAL IMPRESSION / ASSESSMENT AND PLAN / ED COURSE  Pertinent lab results that were available during my care of the patient were reviewed by me and considered in my medical decision making (see chart for details).  Patient's diagnosis is consistent with viral upper respiratory infection. Patient will be discharged home with prescriptions for Flonase, Bromfed-DM, Magic mouthwash to use as directed. Patient is to follow up with [facility] or her local primary care  provider if symptoms persist past this treatment course. Patient is given ED precautions to return to the ED for any worsening or new symptoms.    ____________________________________________  FINAL CLINICAL IMPRESSION(S) / ED DIAGNOSES  Final diagnoses:  Viral upper respiratory infection      NEW MEDICATIONS STARTED DURING THIS VISIT:  New Prescriptions   BROMPHENIRAMINE-PSEUDOEPHEDRINE-DM 30-2-10 MG/5ML SYRUP    Take 10 mLs by mouth 4 (four) times daily as needed.   FLUTICASONE (FLONASE) 50 MCG/ACT NASAL SPRAY    Place 2 sprays into both nostrils daily.   MAGIC MOUTHWASH W/LIDOCAINE SOLN    Take 5 mLs by mouth 4 (four) times daily as needed for mouth pain.         Hope Pigeon, PA-C 01/29/16 2002  Gayla Doss, MD 01/29/16 2358

## 2016-01-29 NOTE — Discharge Instructions (Signed)
Infección del tracto respiratorio superior, adultos  (Upper Respiratory Infection, Adult)  La mayoría de las infecciones del tracto respiratorio superior son infecciones virales de las vías que llevan el aire a los pulmones. Un infección del tracto respiratorio superior afecta la nariz, la garganta y las vías respiratorias superiores. El tipo más frecuente de infección del tracto respiratorio superior es la nasofaringitis, que habitualmente se conoce como "resfrío común".  Las infecciones del tracto respiratorio superior siguen su curso y por lo general se curan solas. En la mayoría de los casos, la infección del tracto respiratorio superior no requiere atención médica, pero a veces, después de una infección viral, puede surgir una infección bacteriana en las vías respiratorias superiores. Esto se conoce como infección secundaria. Las infecciones sinusales y en el oído medio son tipos frecuentes de infecciones secundarias en el tracto respiratorio superior.  La neumonía bacteriana también puede complicar un cuadro de infección del tracto respiratorio superior. Este tipo de infección puede empeorar el asma y la enfermedad pulmonar obstructiva crónica (EPOC). En algunos casos, estas complicaciones pueden requerir atención médica de emergencia y poner en peligro la vida.   CAUSAS  Casi todas las infecciones del tracto respiratorio superior se deben a los virus. Un virus es un tipo de microbio que puede contagiarse de una persona a otra.   FACTORES DE RIESGO  Puede estar en riesgo de sufrir una infección del tracto respiratorio superior si:   · Fuma.  · Tiene una enfermedad pulmonar o cardíaca crónica.  · Tiene debilitado el sistema de defensa (inmunitario) del cuerpo.  · Es muy joven o de edad muy avanzada.  · Tiene asma o alergias nasales.  · Trabaja en áreas donde hay mucha gente o poca ventilación.  · Trabaja en una escuela o en un centro de atención médica.  SIGNOS Y SÍNTOMAS   Habitualmente, los síntomas aparecen  de 2 a 3 días después de entrar en contacto con el virus del resfrío. La mayoría de las infecciones virales en el tracto respiratorio superior duran de 7 a 10 días. Sin embargo, las infecciones virales en el tracto respiratorio superior a causa del virus de la gripe pueden durar de 14 a 18 días y, habitualmente, son más graves. Entre los síntomas se pueden incluir los siguientes:   · Secreción o congestión nasal.  · Estornudos.  · Tos.  · Dolor de garganta.  · Dolor de cabeza.  · Fatiga.  · Fiebre.  · Pérdida del apetito.  · Dolor en la frente, detrás de los ojos y por encima de los pómulos (dolor sinusal).  · Dolores musculares.  DIAGNÓSTICO   El médico puede diagnosticar una infección del tracto respiratorio superior mediante los siguientes estudios:  · Examen físico.  · Pruebas para verificar si los síntomas no se deben a otra afección, por ejemplo:    Faringitis estreptocócica.    Sinusitis.    Neumonía.    Asma.  TRATAMIENTO   Esta infección desaparece sola, con el tiempo. No puede curarse con medicamentos, pero a menudo se prescriben para aliviar los síntomas. Los medicamentos pueden ser útiles para lo siguiente:  · Bajar la fiebre.  · Reducir la tos.  · Aliviar la congestión nasal.  INSTRUCCIONES PARA EL CUIDADO EN EL HOGAR   · Tome los medicamentos solamente como se lo haya indicado el médico.  · A fin de aliviar el dolor de garganta, haga gárgaras con solución salina templada o consuma caramelos para la tos, como se lo haya indicado el médico.  · Use un humidificador   de vapor cálido o inhale el vapor de la ducha para aumentar la humedad del aire. Esto facilitará la respiración.  · Beba suficiente líquido para mantener la orina clara o de color amarillo pálido.  · Consuma sopas y otros caldos transparentes, y aliméntese bien.  · Descanse todo lo que sea necesario.  · Regrese al trabajo cuando la temperatura se le haya normalizado o cuando el médico lo autorice. Es posible que deba quedarse en su casa durante  un tiempo prolongado, para no infectar a los demás. También puede usar un barbijo y lavarse las manos con cuidado para evitar la propagación del virus.  · Aumente el uso del inhalador si tiene asma.  · No consuma ningún producto que contenga tabaco, lo que incluye cigarrillos, tabaco de mascar o cigarrillos electrónicos. Si necesita ayuda para dejar de fumar, consulte al médico.  PREVENCIÓN   La mejor manera de protegerse de un resfrío es mantener una higiene adecuada.   · Evite el contacto oral o físico con personas que tengan síntomas de resfrío.  · En caso de contacto, lávese las manos con frecuencia.  No hay pruebas claras de que la vitamina C, la vitamina E, la equinácea o el ejercicio reduzcan la probabilidad de contraer un resfrío. Sin embargo, siempre se recomienda descansar mucho, hacer ejercicio y alimentarse bien.   SOLICITE ATENCIÓN MÉDICA SI:   · Su estado empeora en lugar de mejorar.  · Los medicamentos no logran controlar los síntomas.  · Tiene escalofríos.  · La sensación de falta de aire empeora.  · Tiene mucosidad marrón o roja.  · Tiene secreción nasal amarilla o marrón.  · Le duele la cara, especialmente al inclinarse hacia adelante.  · Tiene fiebre.  · Tiene los ganglios del cuello hinchados.  · Siente dolor al tragar.  · Tiene zonas blancas en la parte de atrás de la garganta.  SOLICITE ATENCIÓN MÉDICA DE INMEDIATO SI:   · Tiene síntomas intensos o persistentes de:    Dolor de cabeza.    Dolor de oídos.    Dolor sinusal.    Dolor en el pecho.  · Tiene enfermedad pulmonar crónica y cualquiera de estos síntomas:    Sibilancias.    Tos prolongada.    Tos con sangre.    Cambio en la mucosidad habitual.  · Presenta rigidez en el cuello.  · Tiene cambios en:    La visión.    La audición.    El pensamiento.    El estado de ánimo.  ASEGÚRESE DE QUE:   · Comprende estas instrucciones.  · Controlará su afección.  · Recibirá ayuda de inmediato si no mejora o si empeora.     Esta información no tiene como  fin reemplazar el consejo del médico. Asegúrese de hacerle al médico cualquier pregunta que tenga.     Document Released: 08/17/2005 Document Revised: 03/24/2015  Elsevier Interactive Patient Education ©2016 Elsevier Inc.

## 2016-01-29 NOTE — ED Notes (Signed)
C/o body aches, cough, headache, congestion x 2 days.

## 2016-12-22 DIAGNOSIS — O200 Threatened abortion: Secondary | ICD-10-CM

## 2016-12-22 HISTORY — DX: Threatened abortion: O20.0

## 2017-01-18 ENCOUNTER — Telehealth: Payer: Self-pay

## 2017-01-18 NOTE — Telephone Encounter (Signed)
Pt. Called wanting to resch her appt b/c she had to cxl her appt last Fri d/c sick child.  Pt had already made appt before I returned her call

## 2017-01-19 ENCOUNTER — Encounter: Payer: Self-pay | Admitting: Advanced Practice Midwife

## 2017-01-19 ENCOUNTER — Encounter (INDEPENDENT_AMBULATORY_CARE_PROVIDER_SITE_OTHER): Payer: Medicaid Other | Admitting: Advanced Practice Midwife

## 2017-01-19 ENCOUNTER — Ambulatory Visit (INDEPENDENT_AMBULATORY_CARE_PROVIDER_SITE_OTHER): Payer: Medicaid Other | Admitting: Advanced Practice Midwife

## 2017-01-19 VITALS — BP 102/68 | HR 80 | Ht 64.0 in | Wt 208.0 lb

## 2017-01-19 DIAGNOSIS — O039 Complete or unspecified spontaneous abortion without complication: Secondary | ICD-10-CM | POA: Diagnosis not present

## 2017-01-19 NOTE — Progress Notes (Signed)
Subjective: Pt is here for follow up for 1st trimester bleeding/incomplete abortion. She states her bleeding stopped yesterday. She has mild cramping today but no bleeding.   Objective:  Pt is alert and oriented with no sign of distress. She has appropriate affect and is a good historian. No exam is done today, only f/u beta hcg level. Beta Hcg level on 2/19: 2474  Assessment:  21 year old G2 P1011 with likely complete miscarriage  Plan: Beta Hcg level today Follow up as needed Education on warning s/s to look for Education on resuming sexual activity and attempting subsequent pregnancy   Tresea MallJane Merla Sawka, CNM

## 2017-01-20 LAB — BETA HCG QUANT (REF LAB): hCG Quant: 7 m[IU]/mL

## 2017-01-25 LAB — HM PAP SMEAR: HM Pap smear: NEGATIVE

## 2017-01-25 LAB — HM HIV SCREENING LAB: HM HIV Screening: NEGATIVE

## 2017-01-25 NOTE — Progress Notes (Signed)
This encounter was created in error - please disregard.

## 2018-11-23 DIAGNOSIS — Z1388 Encounter for screening for disorder due to exposure to contaminants: Secondary | ICD-10-CM | POA: Diagnosis not present

## 2018-11-23 DIAGNOSIS — Z01419 Encounter for gynecological examination (general) (routine) without abnormal findings: Secondary | ICD-10-CM | POA: Diagnosis not present

## 2018-11-23 DIAGNOSIS — Z113 Encounter for screening for infections with a predominantly sexual mode of transmission: Secondary | ICD-10-CM | POA: Diagnosis not present

## 2018-11-23 DIAGNOSIS — Z308 Encounter for other contraceptive management: Secondary | ICD-10-CM | POA: Diagnosis not present

## 2018-11-23 DIAGNOSIS — Z3009 Encounter for other general counseling and advice on contraception: Secondary | ICD-10-CM | POA: Diagnosis not present

## 2018-11-23 DIAGNOSIS — A5901 Trichomonal vulvovaginitis: Secondary | ICD-10-CM | POA: Diagnosis not present

## 2018-11-23 DIAGNOSIS — Z0389 Encounter for observation for other suspected diseases and conditions ruled out: Secondary | ICD-10-CM | POA: Diagnosis not present

## 2018-12-25 DIAGNOSIS — H16223 Keratoconjunctivitis sicca, not specified as Sjogren's, bilateral: Secondary | ICD-10-CM | POA: Diagnosis not present

## 2019-03-20 DIAGNOSIS — N390 Urinary tract infection, site not specified: Secondary | ICD-10-CM | POA: Diagnosis not present

## 2019-03-20 DIAGNOSIS — R350 Frequency of micturition: Secondary | ICD-10-CM | POA: Diagnosis not present

## 2019-04-24 ENCOUNTER — Ambulatory Visit: Payer: Self-pay | Admitting: Nurse Practitioner

## 2019-06-28 DIAGNOSIS — E663 Overweight: Secondary | ICD-10-CM

## 2019-07-01 ENCOUNTER — Other Ambulatory Visit: Payer: Self-pay

## 2019-07-01 ENCOUNTER — Ambulatory Visit (LOCAL_COMMUNITY_HEALTH_CENTER): Payer: Medicaid Other

## 2019-07-01 VITALS — BP 117/80 | Ht 62.0 in | Wt 216.0 lb

## 2019-07-01 DIAGNOSIS — Z3201 Encounter for pregnancy test, result positive: Secondary | ICD-10-CM

## 2019-07-01 LAB — PREGNANCY, URINE: Preg Test, Ur: POSITIVE — AB

## 2019-07-01 MED ORDER — PRENATAL VITAMIN 27-0.8 MG PO TABS: 1.0000 | ORAL_TABLET | Freq: Every day | ORAL | 0 refills | Status: DC

## 2019-07-01 NOTE — Progress Notes (Signed)
Client plans [facility] at [facility]. Shona Needles, RN

## 2019-07-10 ENCOUNTER — Ambulatory Visit: Payer: Medicaid Other | Admitting: Nurse Practitioner

## 2019-07-10 ENCOUNTER — Ambulatory Visit: Payer: Medicaid Other

## 2019-07-10 ENCOUNTER — Encounter: Payer: Self-pay | Admitting: Nurse Practitioner

## 2019-07-10 ENCOUNTER — Other Ambulatory Visit: Payer: Self-pay

## 2019-07-10 DIAGNOSIS — Z202 Contact with and (suspected) exposure to infections with a predominantly sexual mode of transmission: Secondary | ICD-10-CM

## 2019-07-10 DIAGNOSIS — Z3A08 8 weeks gestation of pregnancy: Secondary | ICD-10-CM

## 2019-07-10 DIAGNOSIS — Z113 Encounter for screening for infections with a predominantly sexual mode of transmission: Secondary | ICD-10-CM

## 2019-07-10 DIAGNOSIS — Z3490 Encounter for supervision of normal pregnancy, unspecified, unspecified trimester: Secondary | ICD-10-CM | POA: Insufficient documentation

## 2019-07-10 DIAGNOSIS — K861 Other chronic pancreatitis: Secondary | ICD-10-CM | POA: Diagnosis not present

## 2019-07-10 MED ORDER — AZITHROMYCIN 500 MG PO TABS
1000.0000 mg | ORAL_TABLET | Freq: Once | ORAL | Status: AC
  Administered 2019-07-10: 11:00:00 1000 mg via ORAL

## 2019-07-10 NOTE — Progress Notes (Signed)
In due to partner recently treated for Chlamydia; desires screening but declines HIV/RPR testing; has OB appt. @ [place], RN

## 2019-07-10 NOTE — Patient Instructions (Signed)
Cervicitis  Cervicitis is when the cervix gets irritated and swollen. Your cervix is the lower end of your uterus. Follow these instructions at home:  Do not have sex until your doctor says it is okay.  Take over-the-counter and prescription medicines only as told by your doctor.  If you were prescribed an antibiotic medicine, take it as told by your doctor. Do not stop taking it even if you start to feel better.  Keep all follow-up visits as told by your doctor. This is important. Contact a doctor if:  Your symptoms come back after treatment.  Your symptoms get worse after treatment.  You have a fever.  You feel tired (fatigued).  Your belly (abdomen) hurts.  You feel like you are going to throw up (are nauseous).  You throw up (vomit).  You have watery poop (diarrhea).  Your back hurts. Get help right away if:  You have very bad pain in your belly, and medicine does not help it.  You cannot pee (urinate). Summary  Cervicitis is when the cervix gets irritated and swollen.  Do not have sex until your doctor says it is okay.  If you need to take an antibiotic, do not stop taking even if you start to feel better. Take medicines only as told by your doctor. This information is not intended to replace advice given to you by your health care provider. Make sure you discuss any questions you have with your health care provider. Document Released: 08/16/2008 Document Revised: 10/20/2017 Document Reviewed: 07/24/2016 Elsevier Patient Education  2020 Elsevier Inc.  

## 2019-07-10 NOTE — Progress Notes (Signed)
STI clinic/screening visit  Subjective:  Mallory Shields is a 23 y.o. female being seen today for an STI screening visit. The patient reports they do not have symptoms.  Patient has the following medical conditions:   Patient Active Problem List   Diagnosis Date Noted  . Miscarriage 01/19/2017  . Pancreatitis, gallstone   . Acute pancreatitis 10/30/2015  . Gallstone 10/30/2015  . Pancreatitis, acute   . Right upper quadrant abdominal pain   . Overweight 02/10/2015     Chief Complaint  Patient presents with  . SEXUALLY TRANSMITTED DISEASE    Here for treatment only - named as contact to Chlamydia   Patient reports  - named as contact to Chlamydia  See flowsheet for further details and programmatic requirements.    The following portions of the patient's history were reviewed and updated as appropriate: allergies, current medications, past medical history, past social history, past surgical history and problem list.  Objective:  There were no vitals filed for this visit.  Physical Exam Vitals signs reviewed.  Constitutional:      Appearance: Normal appearance. She is well-developed and normal weight.  Pulmonary:     Effort: Pulmonary effort is normal.  Skin:    General: Skin is warm and dry.  Neurological:     Mental Status: She is alert.  Psychiatric:        Behavior: Behavior is cooperative.    - declines exam and futher testing at this time.    Assessment and Plan:  Mallory Shields is a 23 y.o. female presenting to the [facility] Department for STI screening  1. Exposure to sexually transmitted disease (STD)  Client 8 wks 3 days gestation EDD - 02/16/2020  Declines exam and additional testing at this time Client has first OB appointment "next week"   Contact to Chlamydia Please treat for Chlamydia per standing order  - azithromycin (ZITHROMAX) tablet 1,000 mg Plan for client to keep OB appointment and have testing at that  time.   Client verbalizes understanding and is in agreement with plan of care   Return if symptoms worsen or fail to improve.  Future Appointments  Date Time Provider [place]  07/19/2019  2:00 PM [facility] NURSE [facility] None    Berniece Andreas, NP

## 2019-07-10 NOTE — Progress Notes (Signed)
Patient treated per standing order as a contact to Chlamydia. Hal Morales, RN

## 2019-07-18 NOTE — Progress Notes (Signed)
      Mallory Shields presents for NOB nurse intake visit. Pregnancy confirmation done at [facility], 07/01/19, with Shona Needles, RN.  G3.  [id number].  LMP 05/12/19.  EDD 02/16/20.  [place] [redacted]w[redacted]d. Pregnancy education material explained and given.  0 cats in the home.  NOB labs ordered. BMI greater than 30. TSH/HbgA1c ordered. Sickle cell order due to race. HIV and drug screen explained and ordered Genetic screening discussed. Genetic testing; Unsure. Pt to discuss genetic testing with provider. PNV encouraged. Pt to follow up with provider in 3 weeks for NOB physical. [place] and FMLA forms reviewed and signed by pt.   BP 104/70   Pulse 69   Ht 5\' 2"  (1.575 m)   Wt 214 lb 12.8 oz (97.4 kg)   LMP 05/12/2019 (Exact Date)   BMI 39.29 kg/m

## 2019-07-19 ENCOUNTER — Other Ambulatory Visit: Payer: Self-pay

## 2019-07-19 ENCOUNTER — Ambulatory Visit (INDEPENDENT_AMBULATORY_CARE_PROVIDER_SITE_OTHER): Payer: Medicaid Other | Admitting: Obstetrics and Gynecology

## 2019-07-19 VITALS — BP 104/70 | HR 69 | Ht 62.0 in | Wt 214.8 lb

## 2019-07-19 DIAGNOSIS — Z113 Encounter for screening for infections with a predominantly sexual mode of transmission: Secondary | ICD-10-CM | POA: Diagnosis not present

## 2019-07-19 DIAGNOSIS — Z0283 Encounter for blood-alcohol and blood-drug test: Secondary | ICD-10-CM

## 2019-07-19 DIAGNOSIS — Z3481 Encounter for supervision of other normal pregnancy, first trimester: Secondary | ICD-10-CM | POA: Diagnosis not present

## 2019-07-19 DIAGNOSIS — R638 Other symptoms and signs concerning food and fluid intake: Secondary | ICD-10-CM

## 2019-07-19 NOTE — Patient Instructions (Signed)
WHAT OB PATIENTS CAN EXPECT   Confirmation of pregnancy and ultrasound ordered if medically indicated-[redacted] weeks gestation  New OB (NOB) intake with nurse and New OB (NOB) labs- [redacted] weeks gestation  New OB (NOB) physical examination with provider- 11/[redacted] weeks gestation  Flu vaccine-[redacted] weeks gestation  Anatomy scan-[redacted] weeks gestation  Glucose tolerance test, blood work to test for anemia, T-dap vaccine-[redacted] weeks gestation  Vaginal swabs/cultures-STD/Group B strep-[redacted] weeks gestation  Appointments every 4 weeks until 28 weeks  Every 2 weeks from 28 weeks until 36 weeks  Weekly visits from 36 weeks until delivery  Prenatal Care Prenatal care is health care during pregnancy. It helps you and your unborn baby (fetus) stay as healthy as possible. Prenatal care may be provided by a midwife, a family practice health care provider, or a childbirth and pregnancy specialist (obstetrician). How does this affect me? During pregnancy, you will be closely monitored for any new conditions that might develop. To lower your risk of pregnancy complications, you and your health care provider will talk about any underlying conditions you have. How does this affect my baby? Early and consistent prenatal care increases the chance that your baby will be healthy during pregnancy. Prenatal care lowers the risk that your baby will be:  Born early (prematurely).  Smaller than expected at birth (small for gestational age). What can I expect at the first prenatal care visit? Your first prenatal care visit will likely be the longest. You should schedule your first prenatal care visit as soon as you know that you are pregnant. Your first visit is a good time to talk about any questions or concerns you have about pregnancy. At your visit, you and your health care provider will talk about:  Your medical history, including: ? Any past pregnancies. ? Your family's medical history. ? The baby's father's medical history.  ? Any long-term (chronic) health conditions you have and how you manage them. ? Any surgeries or procedures you have had. ? Any current over-the-counter or prescription medicines, herbs, or supplements you are taking.  Other factors that could pose a risk to your baby, including:  Your home setting and your stress levels, including: ? Exposure to abuse or violence. ? Household financial strain. ? Mental health conditions you have.  Your daily health habits, including diet and exercise. Your health care provider will also:  Measure your weight, height, and blood pressure.  Do a physical exam, including a pelvic and breast exam.  Perform blood tests and urine tests to check for: ? Urinary tract infection. ? Sexually transmitted infections (STIs). ? Low iron levels in your blood (anemia). ? Blood type and certain proteins on red blood cells (Rh antibodies). ? Infections and immunity to viruses, such as hepatitis B and rubella. ? HIV (human immunodeficiency virus).  Do an ultrasound to confirm your baby's growth and development and to help predict your estimated due date (EDD). This ultrasound is done with a probe that is inserted into the vagina (transvaginal ultrasound).  Discuss your options for genetic screening.  Give you information about how to keep yourself and your baby healthy, including: ? Nutrition and taking vitamins. ? Physical activity. ? How to manage pregnancy symptoms such as nausea and vomiting (morning sickness). ? Infections and substances that may be harmful to your baby and how to avoid them. ? Food safety. ? Dental care. ? Working. ? Travel. ? Warning signs to watch for and when to call your health care provider. How often will  I have prenatal care visits? After your first prenatal care visit, you will have regular visits throughout your pregnancy. The visit schedule is often as follows:  Up to week 28 of pregnancy: once every 4 weeks.  28-36 weeks:  once every 2 weeks.  After 36 weeks: every week until delivery. Some women may have visits more or less often depending on any underlying health conditions and the health of the baby. Keep all follow-up and prenatal care visits as told by your health care provider. This is important. What happens during routine prenatal care visits? Your health care provider will:  Measure your weight and blood pressure.  Check for fetal heart sounds.  Measure the height of your uterus in your abdomen (fundal height). This may be measured starting around week 20 of pregnancy.  Check the position of your baby inside your uterus.  Ask questions about your diet, sleeping patterns, and whether you can feel the baby move.  Review warning signs to watch for and signs of labor.  Ask about any pregnancy symptoms you are having and how you are dealing with them. Symptoms may include: ? Headaches. ? Nausea and vomiting. ? Vaginal discharge. ? Swelling. ? Fatigue. ? Constipation. ? Any discomfort, including back or pelvic pain. Make a list of questions to ask your health care provider at your routine visits. What tests might I have during prenatal care visits? You may have blood, urine, and imaging tests throughout your pregnancy, such as:  Urine tests to check for glucose, protein, or signs of infection.  Glucose tests to check for a form of diabetes that can develop during pregnancy (gestational diabetes mellitus). This is usually done around week 24 of pregnancy.  An ultrasound to check your baby's growth and development and to check for birth defects. This is usually done around week 20 of pregnancy.  A test to check for group B strep (GBS) infection. This is usually done around week 36 of pregnancy.  Genetic testing. This may include blood or imaging tests, such as an ultrasound. Some genetic tests are done during the first trimester and some are done during the second trimester. What else can I  expect during prenatal care visits? Your health care provider may recommend getting certain vaccines during pregnancy. These may include:  A yearly flu shot (annual influenza vaccine). This is especially important if you will be pregnant during flu season.  Tdap (tetanus, diphtheria, pertussis) vaccine. Getting this vaccine during pregnancy can protect your baby from whooping cough (pertussis) after birth. This vaccine may be recommended between weeks 27 and 36 of pregnancy. Later in your pregnancy, your health care provider may give you information about:  Childbirth and breastfeeding classes.  Choosing a health care provider for your baby.  Umbilical cord banking.  Breastfeeding.  Birth control after your baby is born.  The hospital labor and delivery unit and how to tour it.  Registering at the hospital before you go into labor. Where to find more information  Office on Women's Health: LegalWarrants.gl  American Pregnancy Association: americanpregnancy.org  March of Dimes: marchofdimes.org Summary  Prenatal care helps you and your baby stay as healthy as possible during pregnancy.  Your first prenatal care visit will most likely be the longest.  You will have visits and tests throughout your pregnancy to monitor your health and your baby's health.  Bring a list of questions to your visits to ask your health care provider.  Make sure to keep all follow-up and prenatal  care visits with your health care provider. This information is not intended to replace advice given to you by your health care provider. Make sure you discuss any questions you have with your health care provider. Document Released: 11/10/2003 Document Revised: 02/27/2019 Document Reviewed: 11/06/2017 Elsevier Patient Education  [place]. Morning Sickness  Morning sickness is when you feel sick to your stomach (nauseous) during pregnancy. You may feel sick to your stomach and throw up (vomit).  You may feel sick in the morning, but you can feel this way at any time of day. Some women feel very sick to their stomach and cannot stop throwing up (hyperemesis gravidarum). Follow these instructions at home: Medicines  Take over-the-counter and prescription medicines only as told by your doctor. Do not take any medicines until you talk with your doctor about them first.  Taking multivitamins before getting pregnant can stop or lessen the harshness of morning sickness. Eating and drinking  Eat dry toast or crackers before getting out of bed.  Eat 5 or 6 small meals a day.  Eat dry and bland foods like rice and baked potatoes.  Do not eat greasy, fatty, or spicy foods.  Have someone cook for you if the smell of food causes you to feel sick or throw up.  If you feel sick to your stomach after taking prenatal vitamins, take them at night or with a snack.  Eat protein when you need a snack. Nuts, yogurt, and cheese are good choices.  Drink fluids throughout the day.  Try ginger ale made with real ginger, ginger tea made from fresh grated ginger, or ginger candies. General instructions  Do not use any products that have nicotine or tobacco in them, such as cigarettes and e-cigarettes. If you need help quitting, ask your doctor.  Use an air purifier to keep the air in your house free of smells.  Get lots of fresh air.  Try to avoid smells that make you feel sick.  Try: ? Wearing a bracelet that is used for seasickness (acupressure wristband). ? Going to a doctor who puts thin needles into certain body points (acupuncture) to improve how you feel. Contact a doctor if:  You need medicine to feel better.  You feel dizzy or light-headed.  You are losing weight. Get help right away if:  You feel very sick to your stomach and cannot stop throwing up.  You pass out (faint).  You have very bad pain in your belly. Summary  Morning sickness is when you feel sick to your  stomach (nauseous) during pregnancy.  You may feel sick in the morning, but you can feel this way at any time of day.  Making some changes to what you eat may help your symptoms go away. This information is not intended to replace advice given to you by your health care provider. Make sure you discuss any questions you have with your health care provider. Document Released: 12/15/2004 Document Revised: 10/20/2017 Document Reviewed: 12/08/2016 Elsevier Patient Education  2020 [employer]. How a Baby Grows During Pregnancy  Pregnancy begins when a female's sperm enters a female's egg (fertilization). Fertilization usually happens in one of the tubes (fallopian tubes) that connect the ovaries to the womb (uterus). The fertilized egg moves down the fallopian tube to the uterus. Once it reaches the uterus, it implants into the lining of the uterus and begins to grow. For the first 10 weeks, the fertilized egg is called an embryo. After 10 weeks, it  is called a fetus. As the fetus continues to grow, it receives oxygen and nutrients through tissue (placenta) that grows to support the developing baby. The placenta is the life support system for the baby. It provides oxygen and nutrition and removes waste. Learning as much as you can about your pregnancy and how your baby is developing can help you enjoy the experience. It can also make you aware of when there might be a problem and when to ask questions. How long does a typical pregnancy last? A pregnancy usually lasts 280 days, or about 40 weeks. Pregnancy is divided into three periods of growth, also called trimesters:  First trimester: 0-12 weeks.  Second trimester: 13-27 weeks.  Third trimester: 28-40 weeks. The day when your baby is ready to be born (full term) is your estimated date of delivery. How does my baby develop month by month? First month  The fertilized egg attaches to the inside of the uterus.  Some cells will form the placenta.  Others will form the fetus.  The arms, legs, brain, spinal cord, lungs, and heart begin to develop.  At the end of the first month, the heart begins to beat. Second month  The bones, inner ear, eyelids, hands, and feet form.  The genitals develop.  By the end of 8 weeks, all major organs are developing. Third month  All of the internal organs are forming.  Teeth develop below the gums.  Bones and muscles begin to grow. The spine can flex.  The skin is transparent.  Fingernails and toenails begin to form.  Arms and legs continue to grow longer, and hands and feet develop.  The fetus is about 3 inches (7.6 cm) long. Fourth month  The placenta is completely formed.  The external sex organs, neck, outer ear, eyebrows, eyelids, and fingernails are formed.  The fetus can hear, swallow, and move its arms and legs.  The kidneys begin to produce urine.  The skin is covered with a white, waxy coating (vernix) and very fine hair (lanugo). Fifth month  The fetus moves around more and can be felt for the first time (quickening).  The fetus starts to sleep and wake up and may begin to suck its finger.  The nails grow to the end of the fingers.  The organ in the digestive system that makes bile (gallbladder) functions and helps to digest nutrients.  If your baby is a girl, eggs are present in her ovaries. If your baby is a boy, testicles start to move down into his scrotum. Sixth month  The lungs are formed.  The eyes open. The brain continues to develop.  Your baby has fingerprints and toe prints. Your baby's hair grows thicker.  At the end of the second trimester, the fetus is about 9 inches (22.9 cm) long. Seventh month  The fetus kicks and stretches.  The eyes are developed enough to sense changes in light.  The hands can make a grasping motion.  The fetus responds to sound. Eighth month  All organs and body systems are fully developed and functioning.   Bones harden, and taste buds develop. The fetus may hiccup.  Certain areas of the brain are still developing. The skull remains soft. Ninth month  The fetus gains about  lb (0.23 kg) each week.  The lungs are fully developed.  Patterns of sleep develop.  The fetus's head typically moves into a head-down position (vertex) in the uterus to prepare for birth.  The fetus weighs  6-9 lb (2.72-4.08 kg) and is 19-20 inches (48.26-50.8 cm) long. What can I do to have a healthy pregnancy and help my baby develop? General instructions  Take prenatal vitamins as directed by your health care provider. These include vitamins such as folic acid, iron, calcium, and vitamin D. They are important for healthy development.  Take medicines only as directed by your health care provider. Read labels and ask a pharmacist or your health care provider whether over-the-counter medicines, supplements, and prescription drugs are safe to take during pregnancy.  Keep all follow-up visits as directed by your health care provider. This is important. Follow-up visits include prenatal care and screening tests. How do I know if my baby is developing well? At each prenatal visit, your health care provider will do several different tests to check on your health and keep track of your baby's development. These include:  Fundal height and position. ? Your health care provider will measure your growing belly from your pubic bone to the top of the uterus using a tape measure. ? Your health care provider will also feel your belly to determine your baby's position.  Heartbeat. ? An ultrasound in the first trimester can confirm pregnancy and show a heartbeat, depending on how far along you are. ? Your health care provider will check your baby's heart rate at every prenatal visit.  Second trimester ultrasound. ? This ultrasound checks your baby's development. It also may show your baby's gender. What should I do if I have  concerns about my baby's development? Always talk with your health care provider about any concerns that you may have about your pregnancy and your baby. Summary  A pregnancy usually lasts 280 days, or about 40 weeks. Pregnancy is divided into three periods of growth, also called trimesters.  Your health care provider will monitor your baby's growth and development throughout your pregnancy.  Follow your health care provider's recommendations about taking prenatal vitamins and medicines during your pregnancy.  Talk with your health care provider if you have any concerns about your pregnancy or your developing baby. This information is not intended to replace advice given to you by your health care provider. Make sure you discuss any questions you have with your health care provider. Document Released: 04/25/2008 Document Revised: 02/28/2019 Document Reviewed: 09/20/2017 Elsevier Patient Education  2020 [employer]. First Trimester of Pregnancy  The first trimester of pregnancy is from week 1 until the end of week 13 (months 1 through 3). During this time, your baby will begin to develop inside you. At 6-8 weeks, the eyes and face are formed, and the heartbeat can be seen on ultrasound. At the end of 12 weeks, all the baby's organs are formed. Prenatal care is all the medical care you receive before the birth of your baby. Make sure you get good prenatal care and follow all of your doctor's instructions. Follow these instructions at home: Medicines  Take over-the-counter and prescription medicines only as told by your doctor. Some medicines are safe and some medicines are not safe during pregnancy.  Take a prenatal vitamin that contains at least 600 micrograms (mcg) of folic acid.  If you have trouble pooping (constipation), take medicine that will make your stool soft (stool softener) if your doctor approves. Eating and drinking   Eat regular, healthy meals.  Your doctor will tell you  the amount of weight gain that is right for you.  Avoid raw meat and uncooked cheese.  If you feel sick to  your stomach (nauseous) or throw up (vomit): ? Eat 4 or 5 small meals a day instead of 3 large meals. ? Try eating a few soda crackers. ? Drink liquids between meals instead of during meals.  To prevent constipation: ? Eat foods that are high in fiber, like fresh fruits and vegetables, whole grains, and beans. ? Drink enough fluids to keep your pee (urine) clear or pale yellow. Activity  Exercise only as told by your doctor. Stop exercising if you have cramps or pain in your lower belly (abdomen) or low back.  Do not exercise if it is too hot, too humid, or if you are in a place of great height (high altitude).  Try to avoid standing for long periods of time. Move your legs often if you must stand in one place for a long time.  Avoid heavy lifting.  Wear low-heeled shoes. Sit and stand up straight.  You can have sex unless your doctor tells you not to. Relieving pain and discomfort  Wear a good support bra if your breasts are sore.  Take warm water baths (sitz baths) to soothe pain or discomfort caused by hemorrhoids. Use hemorrhoid cream if your doctor says it is okay.  Rest with your legs raised if you have leg cramps or low back pain.  If you have puffy, bulging veins (varicose veins) in your legs: ? Wear support hose or compression stockings as told by your doctor. ? Raise (elevate) your feet for 15 minutes, 3-4 times a day. ? Limit salt in your food. Prenatal care  Schedule your prenatal visits by the twelfth week of pregnancy.  Write down your questions. Take them to your prenatal visits.  Keep all your prenatal visits as told by your doctor. This is important. Safety  Wear your seat belt at all times when driving.  Make a list of emergency phone numbers. The list should include numbers for family, friends, the hospital, and police and fire departments.  General instructions  Ask your doctor for a referral to a local prenatal class. Begin classes no later than at the start of month 6 of your pregnancy.  Ask for help if you need counseling or if you need help with nutrition. Your doctor can give you advice or tell you where to go for help.  Do not use hot tubs, steam rooms, or saunas.  Do not douche or use tampons or scented sanitary pads.  Do not cross your legs for long periods of time.  Avoid all herbs and alcohol. Avoid drugs that are not approved by your doctor.  Do not use any tobacco products, including cigarettes, chewing tobacco, and electronic cigarettes. If you need help quitting, ask your doctor. You may get counseling or other support to help you quit.  Avoid cat litter boxes and soil used by cats. These carry germs that can cause birth defects in the baby and can cause a loss of your baby (miscarriage) or stillbirth.  Visit your dentist. At home, brush your teeth with a soft toothbrush. Be gentle when you floss. Contact a doctor if:  You are dizzy.  You have mild cramps or pressure in your lower belly.  You have a nagging pain in your belly area.  You continue to feel sick to your stomach, you throw up, or you have watery poop (diarrhea).  You have a bad smelling fluid coming from your vagina.  You have pain when you pee (urinate).  You have increased puffiness (swelling) in your  face, hands, legs, or ankles. Get help right away if:  You have a fever.  You are leaking fluid from your vagina.  You have spotting or bleeding from your vagina.  You have very bad belly cramping or pain.  You gain or lose weight rapidly.  You throw up blood. It may look like coffee grounds.  You are around people who have [place] measles, fifth disease, or chickenpox.  You have a very bad headache.  You have shortness of breath.  You have any kind of trauma, such as from a fall or a car accident. Summary  The first  trimester of pregnancy is from week 1 until the end of week 13 (months 1 through 3).  To take care of yourself and your unborn baby, you will need to eat healthy meals, take medicines only if your doctor tells you to do so, and do activities that are safe for you and your baby.  Keep all follow-up visits as told by your doctor. This is important as your doctor will have to ensure that your baby is healthy and growing well. This information is not intended to replace advice given to you by your health care provider. Make sure you discuss any questions you have with your health care provider. Document Released: 04/25/2008 Document Revised: 02/28/2019 Document Reviewed: 11/15/2016 Elsevier Patient Education  2020 [employer]

## 2019-07-20 LAB — URINALYSIS, ROUTINE W REFLEX MICROSCOPIC
Bilirubin, UA: NEGATIVE
Glucose, UA: NEGATIVE
Ketones, UA: NEGATIVE
Nitrite, UA: NEGATIVE
Protein,UA: NEGATIVE
RBC, UA: NEGATIVE
Specific Gravity, UA: 1.016 (ref 1.005–1.030)
Urobilinogen, Ur: 1 mg/dL (ref 0.2–1.0)
pH, UA: 6.5 (ref 5.0–7.5)

## 2019-07-20 LAB — MICROSCOPIC EXAMINATION
Casts: NONE SEEN /lpf
RBC, Urine: NONE SEEN /hpf (ref 0–2)

## 2019-07-21 LAB — RUBELLA SCREEN: Rubella Antibodies, IGG: 2.51 index (ref >0.99)

## 2019-07-21 LAB — RPR: RPR Ser Ql: NONREACTIVE

## 2019-07-21 LAB — DRUG PROFILE, UR, 9 DRUGS (LABCORP)
Amphetamines, Urine: NEGATIVE ng/mL
Barbiturate Quant, Ur: NEGATIVE ng/mL
Benzodiazepine Quant, Ur: NEGATIVE ng/mL
Cannabinoid Quant, Ur: NEGATIVE ng/mL
Cocaine (Metab.): NEGATIVE ng/mL
Methadone Screen, Urine: NEGATIVE ng/mL
Opiate Quant, Ur: NEGATIVE ng/mL
PCP Quant, Ur: NEGATIVE ng/mL
Propoxyphene: NEGATIVE ng/mL

## 2019-07-21 LAB — TSH: TSH: 1.11 u[IU]/mL (ref 0.450–4.500)

## 2019-07-21 LAB — HEPATITIS B SURFACE ANTIGEN: Hepatitis B Surface Ag: NEGATIVE

## 2019-07-21 LAB — HGB SOLU + RFLX FRAC: Sickle Solubility Test - HGBRFX: NEGATIVE

## 2019-07-21 LAB — TOXOPLASMA ANTIBODIES- IGG AND  IGM
Toxoplasma Antibody- IgM: 3.8 AU/mL (ref 0.0–7.9)
Toxoplasma IgG Ratio: <3 IU/mL (ref 0.0–7.1)

## 2019-07-21 LAB — HEMOGLOBIN A1C
Est. average glucose Bld gHb Est-mCnc: 126 mg/dL
Hgb A1c MFr Bld: 6 % — ABNORMAL HIGH (ref 4.8–5.6)

## 2019-07-21 LAB — NICOTINE SCREEN, URINE: Cotinine Ql Scrn, Ur: NEGATIVE ng/mL

## 2019-07-21 LAB — ABO AND RH: Rh Factor: POSITIVE

## 2019-07-21 LAB — HIV ANTIBODY (ROUTINE TESTING W REFLEX): HIV Screen 4th Generation wRfx: NONREACTIVE

## 2019-07-21 LAB — VARICELLA ZOSTER ANTIBODY, IGG: Varicella zoster IgG: 951 index (ref >165)

## 2019-07-22 LAB — CULTURE, OB URINE

## 2019-07-22 LAB — URINE CULTURE, OB REFLEX

## 2019-07-23 LAB — GC/CHLAMYDIA PROBE AMP
Chlamydia trachomatis, NAA: NEGATIVE
Neisseria Gonorrhoeae by PCR: NEGATIVE

## 2019-07-24 ENCOUNTER — Ambulatory Visit (INDEPENDENT_AMBULATORY_CARE_PROVIDER_SITE_OTHER): Payer: Medicaid Other

## 2019-07-24 ENCOUNTER — Other Ambulatory Visit: Payer: Self-pay

## 2019-07-24 DIAGNOSIS — Z3481 Encounter for supervision of other normal pregnancy, first trimester: Secondary | ICD-10-CM

## 2019-07-25 ENCOUNTER — Encounter: Payer: Self-pay | Admitting: Obstetrics and Gynecology

## 2019-08-05 NOTE — Patient Instructions (Signed)
Second Trimester of Pregnancy  The second trimester is from week 14 through week 27 (month 4 through 6). This is often the time in pregnancy that you feel your best. Often times, morning sickness has lessened or quit. You may have more energy, and you may get hungry more often. Your unborn baby is growing rapidly. At the end of the sixth month, he or she is about 9 inches long and weighs about 1 pounds. You will likely feel the baby move between 18 and 20 weeks of pregnancy. Follow these instructions at home: Medicines  Take over-the-counter and prescription medicines only as told by your doctor. Some medicines are safe and some medicines are not safe during pregnancy.  Take a prenatal vitamin that contains at least 600 micrograms (mcg) of folic acid.  If you have trouble pooping (constipation), take medicine that will make your stool soft (stool softener) if your doctor approves. Eating and drinking   Eat regular, healthy meals.  Avoid raw meat and uncooked cheese.  If you get low calcium from the food you eat, talk to your doctor about taking a daily calcium supplement.  Avoid foods that are high in fat and sugars, such as fried and sweet foods.  If you feel sick to your stomach (nauseous) or throw up (vomit): ? Eat 4 or 5 small meals a day instead of 3 large meals. ? Try eating a few soda crackers. ? Drink liquids between meals instead of during meals.  To prevent constipation: ? Eat foods that are high in fiber, like fresh fruits and vegetables, whole grains, and beans. ? Drink enough fluids to keep your pee (urine) clear or pale yellow. Activity  Exercise only as told by your doctor. Stop exercising if you start to have cramps.  Do not exercise if it is too hot, too humid, or if you are in a place of great height (high altitude).  Avoid heavy lifting.  Wear low-heeled shoes. Sit and stand up straight.  You can continue to have sex unless your doctor tells you not to.  Relieving pain and discomfort  Wear a good support bra if your breasts are tender.  Take warm water baths (sitz baths) to soothe pain or discomfort caused by hemorrhoids. Use hemorrhoid cream if your doctor approves.  Rest with your legs raised if you have leg cramps or low back pain.  If you develop puffy, bulging veins (varicose veins) in your legs: ? Wear support hose or compression stockings as told by your doctor. ? Raise (elevate) your feet for 15 minutes, 3-4 times a day. ? Limit salt in your food. Prenatal care  Write down your questions. Take them to your prenatal visits.  Keep all your prenatal visits as told by your doctor. This is important. Safety  Wear your seat belt when driving.  Make a list of emergency phone numbers, including numbers for family, friends, the hospital, and police and fire departments. General instructions  Ask your doctor about the right foods to eat or for help finding a counselor, if you need these services.  Ask your doctor about local prenatal classes. Begin classes before month 6 of your pregnancy.  Do not use hot tubs, steam rooms, or saunas.  Do not douche or use tampons or scented sanitary pads.  Do not cross your legs for long periods of time.  Visit your dentist if you have not done so. Use a soft toothbrush to brush your teeth. Floss gently.  Avoid all smoking, herbs,  and alcohol. Avoid drugs that are not approved by your doctor.  Do not use any products that contain nicotine or tobacco, such as cigarettes and e-cigarettes. If you need help quitting, ask your doctor.  Avoid cat litter boxes and soil used by cats. These carry germs that can cause birth defects in the baby and can cause a loss of your baby (miscarriage) or stillbirth. Contact a doctor if:  You have mild cramps or pressure in your lower belly.  You have pain when you pee (urinate).  You have bad smelling fluid coming from your vagina.  You continue to feel  sick to your stomach (nauseous), throw up (vomit), or have watery poop (diarrhea).  You have a nagging pain in your belly area.  You feel dizzy. Get help right away if:  You have a fever.  You are leaking fluid from your vagina.  You have spotting or bleeding from your vagina.  You have severe belly cramping or pain.  You lose or gain weight rapidly.  You have trouble catching your breath and have chest pain.  You notice sudden or extreme puffiness (swelling) of your face, hands, ankles, feet, or legs.  You have not felt the baby move in over an hour.  You have severe headaches that do not go away when you take medicine.  You have trouble seeing. Summary  The second trimester is from week 14 through week 27 (months 4 through 6). This is often the time in pregnancy that you feel your best.  To take care of yourself and your unborn baby, you will need to eat healthy meals, take medicines only if your doctor tells you to do so, and do activities that are safe for you and your baby.  Call your doctor if you get sick or if you notice anything unusual about your pregnancy. Also, call your doctor if you need help with the right food to eat, or if you want to know what activities are safe for you. This information is not intended to replace advice given to you by your health care provider. Make sure you discuss any questions you have with your health care provider. Document Released: 02/01/2010 Document Revised: 03/01/2019 Document Reviewed: 12/13/2016 Elsevier Patient Education  2020 [employer]. How a Baby Grows During Pregnancy  Pregnancy begins when a female's sperm enters a female's egg (fertilization). Fertilization usually happens in one of the tubes (fallopian tubes) that connect the ovaries to the womb (uterus). The fertilized egg moves down the fallopian tube to the uterus. Once it reaches the uterus, it implants into the lining of the uterus and begins to grow. For the first  10 weeks, the fertilized egg is called an embryo. After 10 weeks, it is called a fetus. As the fetus continues to grow, it receives oxygen and nutrients through tissue (placenta) that grows to support the developing baby. The placenta is the life support system for the baby. It provides oxygen and nutrition and removes waste. Learning as much as you can about your pregnancy and how your baby is developing can help you enjoy the experience. It can also make you aware of when there might be a problem and when to ask questions. How long does a typical pregnancy last? A pregnancy usually lasts 280 days, or about 40 weeks. Pregnancy is divided into three periods of growth, also called trimesters:  First trimester: 0-12 weeks.  Second trimester: 13-27 weeks.  Third trimester: 28-40 weeks. The day when your baby is  ready to be born (full term) is your estimated date of delivery. How does my baby develop month by month? First month  The fertilized egg attaches to the inside of the uterus.  Some cells will form the placenta. Others will form the fetus.  The arms, legs, brain, spinal cord, lungs, and heart begin to develop.  At the end of the first month, the heart begins to beat. Second month  The bones, inner ear, eyelids, hands, and feet form.  The genitals develop.  By the end of 8 weeks, all major organs are developing. Third month  All of the internal organs are forming.  Teeth develop below the gums.  Bones and muscles begin to grow. The spine can flex.  The skin is transparent.  Fingernails and toenails begin to form.  Arms and legs continue to grow longer, and hands and feet develop.  The fetus is about 3 inches (7.6 cm) long. Fourth month  The placenta is completely formed.  The external sex organs, neck, outer ear, eyebrows, eyelids, and fingernails are formed.  The fetus can hear, swallow, and move its arms and legs.  The kidneys begin to produce urine.  The  skin is covered with a white, waxy coating (vernix) and very fine hair (lanugo). Fifth month  The fetus moves around more and can be felt for the first time (quickening).  The fetus starts to sleep and wake up and may begin to suck its finger.  The nails grow to the end of the fingers.  The organ in the digestive system that makes bile (gallbladder) functions and helps to digest nutrients.  If your baby is a girl, eggs are present in her ovaries. If your baby is a boy, testicles start to move down into his scrotum. Sixth month  The lungs are formed.  The eyes open. The brain continues to develop.  Your baby has fingerprints and toe prints. Your baby's hair grows thicker.  At the end of the second trimester, the fetus is about 9 inches (22.9 cm) long. Seventh month  The fetus kicks and stretches.  The eyes are developed enough to sense changes in light.  The hands can make a grasping motion.  The fetus responds to sound. Eighth month  All organs and body systems are fully developed and functioning.  Bones harden, and taste buds develop. The fetus may hiccup.  Certain areas of the brain are still developing. The skull remains soft. Ninth month  The fetus gains about  lb (0.23 kg) each week.  The lungs are fully developed.  Patterns of sleep develop.  The fetus's head typically moves into a head-down position (vertex) in the uterus to prepare for birth.  The fetus weighs 6-9 lb (2.72-4.08 kg) and is 19-20 inches (48.26-50.8 cm) long. What can I do to have a healthy pregnancy and help my baby develop? General instructions  Take prenatal vitamins as directed by your health care provider. These include vitamins such as folic acid, iron, calcium, and vitamin D. They are important for healthy development.  Take medicines only as directed by your health care provider. Read labels and ask a pharmacist or your health care provider whether over-the-counter medicines,  supplements, and prescription drugs are safe to take during pregnancy.  Keep all follow-up visits as directed by your health care provider. This is important. Follow-up visits include prenatal care and screening tests. How do I know if my baby is developing well? At each prenatal visit, your health care  provider will do several different tests to check on your health and keep track of your baby's development. These include:  Fundal height and position. ? Your health care provider will measure your growing belly from your pubic bone to the top of the uterus using a tape measure. ? Your health care provider will also feel your belly to determine your baby's position.  Heartbeat. ? An ultrasound in the first trimester can confirm pregnancy and show a heartbeat, depending on how far along you are. ? Your health care provider will check your baby's heart rate at every prenatal visit.  Second trimester ultrasound. ? This ultrasound checks your baby's development. It also may show your baby's gender. What should I do if I have concerns about my baby's development? Always talk with your health care provider about any concerns that you may have about your pregnancy and your baby. Summary  A pregnancy usually lasts 280 days, or about 40 weeks. Pregnancy is divided into three periods of growth, also called trimesters.  Your health care provider will monitor your baby's growth and development throughout your pregnancy.  Follow your health care provider's recommendations about taking prenatal vitamins and medicines during your pregnancy.  Talk with your health care provider if you have any concerns about your pregnancy or your developing baby. This information is not intended to replace advice given to you by your health care provider. Make sure you discuss any questions you have with your health care provider. Document Released: 04/25/2008 Document Revised: 02/28/2019 Document Reviewed: 09/20/2017  Elsevier Patient Education  2020 [employer]. Commonly Asked Questions During Pregnancy  Cats: A parasite can be excreted in cat feces.  To avoid exposure you need to have another person empty the little box.  If you must empty the litter box you will need to wear gloves.  Wash your hands after handling your cat.  This parasite can also be found in raw or undercooked meat so this should also be avoided.  Colds, Sore Throats, Flu: Please check your medication sheet to see what you can take for symptoms.  If your symptoms are unrelieved by these medications please call the office.  Dental Work: Most any dental work Investment banker, corporate recommends is permitted.  X-rays should only be taken during the first trimester if absolutely necessary.  Your abdomen should be shielded with a lead apron during all x-rays.  Please notify your provider prior to receiving any x-rays.  Novocaine is fine; gas is not recommended.  If your dentist requires a note from [place] prior to dental work please call the office and we will provide one for you.  Exercise: Exercise is an important part of staying healthy during your pregnancy.  You may continue most exercises you were accustomed to prior to pregnancy.  Later in your pregnancy you will most likely notice you have difficulty with activities requiring balance like riding a bicycle.  It is important that you listen to your body and avoid activities that put you at a higher risk of falling.  Adequate rest and staying well hydrated are a must!  If you have questions about the safety of specific activities ask your provider.    Exposure to Children with illness: Try to avoid obvious exposure; report any symptoms to [place] when noted,  If you have chicken pos, red measles or mumps, you should be immune to these diseases.   Please do not take any vaccines while pregnant unless you have checked with your OB provider.  Fetal Movement: After 28 weeks we recommend you do "kick counts" twice  daily.  Lie or sit down in a calm quiet environment and count your baby movements "kicks".  You should feel your baby at least 10 times per hour.  If you have not felt 10 kicks within the first hour get up, walk around and have something sweet to eat or drink then repeat for an additional hour.  If count remains less than 10 per hour notify your provider.  Fumigating: Follow your pest control agent's advice as to how long to stay out of your home.  Ventilate the area well before re-entering.  Hemorrhoids:   Most over-the-counter preparations can be used during pregnancy.  Check your medication to see what is safe to use.  It is important to use a stool softener or fiber in your diet and to drink lots of liquids.  If hemorrhoids seem to be getting worse please call the office.   Hot Tubs:  Hot tubs Jacuzzis and saunas are not recommended while pregnant.  These increase your internal body temperature and should be avoided.  Intercourse:  Sexual intercourse is safe during pregnancy as long as you are comfortable, unless otherwise advised by your provider.  Spotting may occur after intercourse; report any bright red bleeding that is heavier than spotting.  Labor:  If you know that you are in labor, please go to the hospital.  If you are unsure, please call the office and let us help you decide what to do.  Lifting, straining, etc:  If your job requires heavy lifting or straining please check with your provider for any limitations.  Generally, you should not lift items heavier than that you can lift simply with your hands and arms (no back muscles)  Painting:  Paint fumes do not harm your pregnancy, but may make you ill and should be avoided if possible.  Latex or water based paints have less odor than oils.  Use adequate ventilation while painting.  Permanents & Hair Color:  Chemicals in hair dyes are not recommended as they cause increase hair dryness which can increase hair loss during pregnancy.  "  Highlighting" and permanents are allowed.  Dye may be absorbed differently and permanents may not hold as well during pregnancy.  Sunbathing:  Use a sunscreen, as skin burns easily during pregnancy.  Drink plenty of fluids; avoid over heating.  Tanning Beds:  Because their possible side effects are still unknown, tanning beds are not recommended.  Ultrasound Scans:  Routine ultrasounds are performed at approximately 20 weeks.  You will be able to see your baby's general anatomy an if you would like to know the gender this can usually be determined as well.  If it is questionable when you conceived you may also receive an ultrasound early in your pregnancy for dating purposes.  Otherwise ultrasound exams are not routinely performed unless there is a medical necessity.  Although you can request a scan we ask that you pay for it when conducted because insurance does not cover " patient request" scans.  Work: If your pregnancy proceeds without complications you may work until your due date, unless your physician or employer advises otherwise.  Round Ligament Pain/Pelvic Discomfort:  Sharp, shooting pains not associated with bleeding are fairly common, usually occurring in the second trimester of pregnancy.  They tend to be worse when standing up or when you remain standing for long periods of time.  These are the result of pressure of  certain pelvic ligaments called "round ligaments".  Rest, Tylenol and heat seem to be the most effective relief.  As the womb and fetus grow, they rise out of the pelvis and the discomfort improves.  Please notify the office if your pain seems different than that described.  It may represent a more serious condition.  Common Medications Safe in Pregnancy  Acne:      Constipation:  Benzoyl Peroxide     Colace  Clindamycin      Dulcolax Suppository  Topica Erythromycin     Fibercon  Salicylic Acid      Metamucil         Miralax AVOID:        Senakot   Accutane    Cough:   Retin-A       Cough Drops  Tetracycline      Phenergan w/ Codeine if Rx  Minocycline      Robitussin (Plain & DM)  Antibiotics:     Crabs/Lice:  Ceclor       RID  Cephalosporins    AVOID:  E-Mycins      Kwell  Keflex  Macrobid/Macrodantin   Diarrhea:  Penicillin      Kao-Pectate  Zithromax      Imodium AD         PUSH FLUIDS AVOID:       Cipro     Fever:  Tetracycline      Tylenol (Regular or Extra  Minocycline       Strength)  Levaquin      Extra Strength-Do not          Exceed 8 tabs/24 hrs Caffeine:        <255m/day (equiv. To 1 cup of coffee or  approx. 3 12 oz sodas)         Gas: Cold/Hayfever:       Gas-X  Benadryl      Mylicon  Claritin       Phazyme  **Claritin-D        Chlor-Trimeton    Headaches:  Dimetapp      ASA-Free Excedrin  Drixoral-Non-Drowsy     Cold Compress  Mucinex (Guaifenasin)     Tylenol (Regular or Extra  Sudafed/Sudafed-12 Hour     Strength)  **Sudafed PE Pseudoephedrine   Tylenol Cold & Sinus     Vicks Vapor Rub  Zyrtec  **AVOID if Problems With Blood Pressure         Heartburn: Avoid lying down for at least 1 hour after meals  Aciphex      Maalox     Rash:  Milk of Magnesia     Benadryl    Mylanta       1% Hydrocortisone Cream  Pepcid  Pepcid Complete   Sleep Aids:  Prevacid      Ambien   Prilosec       Benadryl  Rolaids       Chamomile Tea  Tums (Limit 4/day)     Unisom  Zantac       Tylenol PM         Warm milk-add vanilla or  Hemorrhoids:       Sugar for taste  Anusol/Anusol H.C.  (RX: Analapram 2.5%)  Sugar Substitutes:  Hydrocortisone OTC     Ok in moderation  Preparation H      Tucks        Vaseline lotion applied to tissue with wiping    Herpes:     Throat:  Acyclovir      Oragel  Famvir  Valtrex     Vaccines:         Flu Shot Leg Cramps:       *Gardasil  Benadryl      Hepatitis A         Hepatitis B Nasal Spray:       Pneumovax  Saline Nasal Spray     Polio Booster         Tetanus Nausea:        Tuberculosis test or PPD  Vitamin B6 25 mg TID   AVOID:    Dramamine      *Gardasil  Emetrol       Live Poliovirus  Ginger Root 250 mg QID    MMR (measles, mumps &  High Complex Carbs @ Bedtime    rebella)  Sea Bands-Accupressure    Varicella (Chickenpox)  Unisom 1/2 tab TID     *No known complications           If received before Pain:         Known pregnancy;   Darvocet       Resume series after  Lortab        Delivery  Percocet    Yeast:   Tramadol      Femstat  Tylenol 3      Gyne-lotrimin  Ultram       Monistat  Vicodin           MISC:         All Sunscreens           Hair Coloring/highlights          Insect Repellant's          (Including DEET)         Mystic Tans

## 2019-08-05 NOTE — Progress Notes (Signed)
Pt is present for NOB PE. Pt stated that she was doing well and is not interested in doing genetic testing. Pt stated having a lot of morning sickness but is not interested in taking any medication at this time. Pt stated that she would wait to see how she feels after the first trimester.

## 2019-08-06 ENCOUNTER — Other Ambulatory Visit: Payer: Self-pay

## 2019-08-06 ENCOUNTER — Ambulatory Visit (INDEPENDENT_AMBULATORY_CARE_PROVIDER_SITE_OTHER): Payer: Medicaid Other | Admitting: Obstetrics and Gynecology

## 2019-08-06 ENCOUNTER — Telehealth: Payer: Self-pay | Admitting: Obstetrics and Gynecology

## 2019-08-06 ENCOUNTER — Encounter: Payer: Self-pay | Admitting: Obstetrics and Gynecology

## 2019-08-06 VITALS — BP 111/73 | HR 70 | Ht 62.0 in | Wt 209.4 lb

## 2019-08-06 DIAGNOSIS — O99210 Obesity complicating pregnancy, unspecified trimester: Secondary | ICD-10-CM

## 2019-08-06 DIAGNOSIS — O99211 Obesity complicating pregnancy, first trimester: Secondary | ICD-10-CM | POA: Diagnosis not present

## 2019-08-06 DIAGNOSIS — R7309 Other abnormal glucose: Secondary | ICD-10-CM | POA: Diagnosis not present

## 2019-08-06 DIAGNOSIS — Z3A12 12 weeks gestation of pregnancy: Secondary | ICD-10-CM | POA: Diagnosis not present

## 2019-08-06 DIAGNOSIS — O2341 Unspecified infection of urinary tract in pregnancy, first trimester: Secondary | ICD-10-CM | POA: Insufficient documentation

## 2019-08-06 DIAGNOSIS — O9989 Other specified diseases and conditions complicating pregnancy, childbirth and the puerperium: Secondary | ICD-10-CM

## 2019-08-06 DIAGNOSIS — Z3491 Encounter for supervision of normal pregnancy, unspecified, first trimester: Secondary | ICD-10-CM

## 2019-08-06 LAB — POCT URINALYSIS DIPSTICK OB
Bilirubin, UA: NEGATIVE
Glucose, UA: NEGATIVE
Ketones, UA: NEGATIVE
Nitrite, UA: NEGATIVE
POC,PROTEIN,UA: NEGATIVE
Spec Grav, UA: 1.01 (ref 1.010–1.025)
Urobilinogen, UA: 0.2 E.U./dL
pH, UA: 6.5 (ref 5.0–8.0)

## 2019-08-06 MED ORDER — NITROFURANTOIN MONOHYD MACRO 100 MG PO CAPS: 100.0000 mg | ORAL_CAPSULE | Freq: Two times a day (BID) | ORAL | 0 refills | Status: DC

## 2019-08-06 NOTE — Telephone Encounter (Signed)
Spoke with pt and sent in medication for her UTI.

## 2019-08-06 NOTE — Telephone Encounter (Signed)
The patient called and stated that she would like to speak with her nurse or provider if possible in regards to a prescription that was to be sent to her pharmacy 2 wks ago, and is still not there. Please advise.

## 2019-08-06 NOTE — Progress Notes (Signed)
OBSTETRIC INITIAL PRENATAL VISIT  Subjective:    Mallory Shields is being seen today for her first obstetrical visit.  This is a planned pregnancy. She is a 23 y.o. [id number] female at 9476w2d gestation, Estimated Date of Delivery: 02/16/20 with Patient's last menstrual period was 05/12/2019 (exact date), consistent with 8 week sono. Her obstetrical history is significant for none. Relationship with FOB: significant other, living together. Is not the FOB of first child. Patient does intend to breast feed. Pregnancy history fully reviewed.    OB History  Gravida Para Term Preterm AB Living  3 1 1  0 1 1  SAB TAB Ectopic Multiple Live Births  1 0 0 0 1    # Outcome Date GA Lbr Len/2nd Weight Sex Delivery Anes PTL Lv  3 Current           2 SAB 02/2017 3279w0d         1 Term 09/19/15 5429w3d 23:21 / 00:07 7 lb 6.5 oz (3.36 kg) F Vag-Spont None  LIV     Name: Carmin RichmondLEJO Shields,GIRL Kylia     Apgar1: 7  Apgar5: 9    Gynecologic History:  Last pap smear was 01/25/2017.  Results were normal.  Denies/admits h/o abnormal pap smears in the past.  Denies history of STI's, however [facility] to Chlam[facility].  Cultures for NOB were negative.  Contraception: none   Past Medical History:  Diagnosis Date  . History of urinary tract infection   . Miscarriage, threatened, early pregnancy 12/2016    Family History  Problem Relation Age of Onset  . Gallstones Mother   . Diabetes Mother   . Gallstones Father   . Glaucoma Father   . Gallstones Sister   . Miscarriages / Stillbirths Neg Hx     Past Surgical History:  Procedure Laterality Date  . CHOLECYSTECTOMY N/A 11/05/2015   Procedure: LAPAROSCOPIC CHOLECYSTECTOMY WITH INTRAOPERATIVE CHOLANGIOGRAM;  Surgeon: Lattie Hawichard E Cooper, MD;  Location: [facility] ORS;  Service: General;  Laterality: N/A;  . ERCP N/A 11/02/2015   Procedure: ENDOSCOPIC RETROGRADE CHOLANGIOPANCREATOGRAPHY (ERCP);   Surgeon: Wallace CullensPaul Y Oh, MD;  [facility];  Service: Endoscopy;  Laterality: N/A;    Social History   Socioeconomic History  . Marital status: Single    Spouse name: Not on file  . Number of children: Not on file  . Years of education: Not on file  . Highest education level: Not on file  Occupational History  . Not on file  Social Needs  . Financial resource strain: Not on file  . Food insecurity    Worry: Not on file    Inability: Not on file  . Transportation needs    Medical: Not on file    Non-medical: Not on file  Tobacco Use  . Smoking status: Never Smoker  . Smokeless tobacco: Never Used  Substance and Sexual Activity  . Alcohol use: Not Currently    Comment: Last use 04/2019  . Drug use: Never  . Sexual activity: Yes    Birth control/protection: None  Lifestyle  . Physical activity    Days per week: Not on file    Minutes per session: Not on file  . Stress: Not on file  Relationships  . Social Musicianconnections    Talks on phone: Not on file    Gets together: Not on file    Attends religious service: Not on file    Active  member of club or organization: Not on file    Attends meetings of clubs or organizations: Not on file    Relationship status: Not on file  . Intimate partner violence    Fear of current or ex partner: No    Emotionally abused: No    Physically abused: No    Forced sexual activity: No  Other Topics Concern  . Not on file  Social History Narrative  . Not on file    Current Outpatient Medications on File Prior to Visit  Medication Sig Dispense Refill  . Prenatal Vit-Fe Fumarate-FA (PRENATAL VITAMIN) 27-0.8 MG TABS Take 1 tablet by mouth daily at 6 (six) AM. 100 tablet 0   No current facility-administered medications on file prior to visit.     No Known Allergies   Review of Systems General: Not Present- Fever, Weight Loss and Weight Gain. Present - Fatigue Skin: Not Present- Rash. HEENT: Not Present- Blurred Vision, Headache  and Bleeding Gums. Respiratory: Not Present- Difficulty Breathing. Breast: Not Present- Breast Mass. Cardiovascular: Not Present- Chest Pain, Elevated Blood Pressure, Fainting / Blacking Out and Shortness of Breath. Gastrointestinal: Not Present- Abdominal Pain, Constipation.  Present - Mild Nausea and Vomiting. Female Genitourinary: Not Present- Frequency, Painful Urination, Pelvic Pain, Vaginal Bleeding, Vaginal Discharge, Contractions, regular, Fetal Movements Decreased, Urinary Complaints and Vaginal Fluid. Musculoskeletal: Not Present-  Leg Cramps.  Present - back pain Neurological: Not Present- Dizziness. Psychiatric: Not Present- Depression.     Objective:   Blood pressure 111/73, pulse 70, height 5\' 2"  (1.575 m), weight 209 lb 7 oz (95 kg), last menstrual period 05/12/2019.  Body mass index is 38.31 kg/m.  General Appearance:    Alert, cooperative, no distress, appears stated age, moderate obesity  Head:    Normocephalic, without obvious abnormality, atraumatic  Eyes:    PERRL, conjunctiva/corneas clear, EOM's intact, both eyes  Ears:    Normal external ear canals, both ears  Nose:   Nares normal, septum midline, mucosa normal, no drainage or sinus tenderness  Throat:   Lips, mucosa, and tongue normal; teeth and gums normal  Neck:   Supple, symmetrical, trachea midline, no adenopathy; thyroid: no enlargement/tenderness/nodules; no carotid bruit or JVD  Back:     Symmetric, no curvature, ROM normal, no CVA tenderness  Lungs:     Clear to auscultation bilaterally, respirations unlabored  Chest Wall:    No tenderness or deformity   Heart:    Regular rate and rhythm, S1 and S2 normal, no murmur, rub or gallop  Breast Exam:    No tenderness, masses, or nipple abnormality  Abdomen:     Soft, non-tender, bowel sounds active all four quadrants, no masses, no organomegaly.  FH 12.  FHT 173 bpm.  Genitalia:    Pelvic:external genitalia normal, vagina without lesions, discharge, or  tenderness, rectovaginal septum  normal. Cervix normal in appearance, no cervical motion tenderness, no adnexal masses or tenderness.  Pregnancy positive findings: uterine enlargement: 12 wk size, nontender.   Rectal:    Normal external sphincter.  No hemorrhoids appreciated. Internal exam not done.   Extremities:   Extremities normal, atraumatic, no cyanosis or edema  Pulses:   2+ and symmetric all extremities  Skin:   Skin color, texture, turgor normal, no rashes or lesions  Lymph nodes:   Cervical, supraclavicular, and axillary nodes normal  Neurologic:   CNII-XII intact, normal strength, sensation and reflexes throughout      Assessment:   Pregnancy at 12 and 2/7 weeks  UTI in pregnancy, antepartum, first trimester Elevated hemoglobin A1c Nausea and Vomiting Obesity in pregnancy  Plan:    Initial labs reviewed.  CBC ordered (not in original prenatal labs).  Elevated A1c of 6 noted on NOB labs.  Discussed need for early glucola.  Prenatal vitamins encouraged.  Also given samples of [place] for nausea/vomiting.  Problem list reviewed and updated. New OB counseling:  The patient has been given an overview regarding routine prenatal care.  Recommendations regarding diet, weight gain, and exercise in pregnancy were given.  Advised to gain no more than 20-25 lbs this pregnancy.  Prenatal testing, optional genetic testing, and ultrasound use in pregnancy were reviewed.  All genetic testing discussed: declined. Benefits of Breast Feeding were discussed. The patient is encouraged to consider nursing her baby post partum. UTI noted on NOB labs, patient also with back pain.  Patient has not yet picked up prescription for Macrobid. Strongly encouraged patient to do so as soon as possible.  Follow up in 4 weeks.  50% of 30 min visit spent on counseling and coordination of care.    The patient has Medicaid.  [facility] Medicaid Risk Screening Form completed today   Hildred Laser, MD [facility]

## 2019-08-07 LAB — CBC
Hematocrit: 38.5 % (ref 34.0–46.6)
Hemoglobin: 12.5 g/dL (ref 11.1–15.9)
MCH: 26.9 pg (ref 26.6–33.0)
MCHC: 32.5 g/dL (ref 31.5–35.7)
MCV: 83 fL (ref 79–97)
Platelets: 321 10*3/uL (ref 150–450)
RBC: 4.65 x10E6/uL (ref 3.77–5.28)
RDW: 14.8 % (ref 11.7–15.4)
WBC: 7.1 10*3/uL (ref 3.4–10.8)

## 2019-09-10 ENCOUNTER — Other Ambulatory Visit: Payer: Medicaid Other

## 2019-09-10 ENCOUNTER — Ambulatory Visit (INDEPENDENT_AMBULATORY_CARE_PROVIDER_SITE_OTHER): Payer: Medicaid Other | Admitting: Obstetrics and Gynecology

## 2019-09-10 ENCOUNTER — Other Ambulatory Visit: Payer: Self-pay

## 2019-09-10 ENCOUNTER — Encounter: Payer: Self-pay | Admitting: Obstetrics and Gynecology

## 2019-09-10 VITALS — BP 114/74 | HR 90 | Wt 208.8 lb

## 2019-09-10 DIAGNOSIS — R7309 Other abnormal glucose: Secondary | ICD-10-CM | POA: Diagnosis not present

## 2019-09-10 DIAGNOSIS — Z3481 Encounter for supervision of other normal pregnancy, first trimester: Secondary | ICD-10-CM | POA: Diagnosis not present

## 2019-09-10 DIAGNOSIS — Z3A17 17 weeks gestation of pregnancy: Secondary | ICD-10-CM

## 2019-09-10 DIAGNOSIS — Z3492 Encounter for supervision of normal pregnancy, unspecified, second trimester: Secondary | ICD-10-CM

## 2019-09-10 LAB — POCT URINALYSIS DIPSTICK OB
Bilirubin, UA: NEGATIVE
Blood, UA: NEGATIVE
Glucose, UA: NEGATIVE
Nitrite, UA: POSITIVE
Spec Grav, UA: 1.01 (ref 1.010–1.025)
Urobilinogen, UA: 0.2 E.U./dL
pH, UA: 6.5 (ref 5.0–8.0)

## 2019-09-10 NOTE — Progress Notes (Signed)
Patient comes in today for ROB visit. No concerns.  

## 2019-09-10 NOTE — Progress Notes (Signed)
ROB: No complaints.  Urine sent for TOC.  Ultrasound FAS ordered.  Quad screen.  Early 1 hr GCT today.

## 2019-09-11 LAB — GLUCOSE, 1 HOUR GESTATIONAL: Gestational Diabetes Screen: 153 mg/dL — ABNORMAL HIGH (ref 65–139)

## 2019-09-11 LAB — ANTIBODY SCREEN: Antibody Screen: NEGATIVE

## 2019-09-12 ENCOUNTER — Telehealth: Payer: Self-pay | Admitting: Obstetrics and Gynecology

## 2019-09-12 NOTE — Telephone Encounter (Signed)
Pt called and aware of test results. Pt made an appointment for 3 hr GTT and aware to not eat or drink anything after midnight the day before her appointment.

## 2019-09-12 NOTE — Telephone Encounter (Signed)
The patient called and stated that she is wanting to speak with a nurse to go over her test results. Please advise.

## 2019-09-13 LAB — URINE CULTURE

## 2019-09-13 LAB — AFP TETRA
DIA Mom Value: 0.65
DIA Value (EIA): 96.09 pg/mL
DSR (By Age)    1 IN: 1022
DSR (Second Trimester) 1 IN: 10000
Gestational Age: 15 WEEKS
MSAFP Mom: 1.05
MSAFP: 25 ng/mL
MSHCG Mom: 0.46
MSHCG: 21116 m[IU]/mL
Maternal Age At EDD: 25.2 yr
Osb Risk: 10000
T18 (By Age): 1:3981 {titer}
Test Results:: NEGATIVE
Weight: 208 [lb_av]
uE3 Mom: 0.74
uE3 Value: 0.52 ng/mL

## 2019-09-18 ENCOUNTER — Other Ambulatory Visit: Payer: Self-pay | Admitting: Surgical

## 2019-09-18 DIAGNOSIS — Z3492 Encounter for supervision of normal pregnancy, unspecified, second trimester: Secondary | ICD-10-CM

## 2019-09-18 MED ORDER — NITROFURANTOIN MONOHYD MACRO 100 MG PO CAPS: 100.0000 mg | ORAL_CAPSULE | Freq: Two times a day (BID) | ORAL | 0 refills | Status: DC

## 2019-09-19 ENCOUNTER — Other Ambulatory Visit: Payer: Self-pay

## 2019-09-19 ENCOUNTER — Other Ambulatory Visit: Payer: Medicaid Other

## 2019-09-19 DIAGNOSIS — Z3492 Encounter for supervision of normal pregnancy, unspecified, second trimester: Secondary | ICD-10-CM

## 2019-09-20 LAB — GESTATIONAL GLUCOSE TOLERANCE
Glucose, Fasting: 112 mg/dL — ABNORMAL HIGH (ref 65–94)
Glucose, GTT - 1 Hour: 187 mg/dL — ABNORMAL HIGH (ref 65–179)
Glucose, GTT - 2 Hour: 130 mg/dL (ref 65–154)
Glucose, GTT - 3 Hour: 129 mg/dL (ref 65–139)

## 2019-09-26 ENCOUNTER — Other Ambulatory Visit: Payer: Self-pay | Admitting: Surgical

## 2019-09-26 DIAGNOSIS — O24419 Gestational diabetes mellitus in pregnancy, unspecified control: Secondary | ICD-10-CM

## 2019-10-01 ENCOUNTER — Encounter: Payer: Medicaid Other | Attending: Obstetrics and Gynecology | Admitting: *Deleted

## 2019-10-01 ENCOUNTER — Encounter: Payer: Self-pay | Admitting: *Deleted

## 2019-10-01 ENCOUNTER — Telehealth: Payer: Self-pay | Admitting: Obstetrics and Gynecology

## 2019-10-01 ENCOUNTER — Other Ambulatory Visit: Payer: Self-pay

## 2019-10-01 VITALS — BP 100/70 | Ht 64.0 in | Wt 202.6 lb

## 2019-10-01 DIAGNOSIS — Z3A00 Weeks of gestation of pregnancy not specified: Secondary | ICD-10-CM | POA: Insufficient documentation

## 2019-10-01 DIAGNOSIS — O24419 Gestational diabetes mellitus in pregnancy, unspecified control: Secondary | ICD-10-CM | POA: Insufficient documentation

## 2019-10-01 DIAGNOSIS — O24410 Gestational diabetes mellitus in pregnancy, diet controlled: Secondary | ICD-10-CM

## 2019-10-01 NOTE — Patient Instructions (Signed)
Read booklet on Gestational Diabetes Follow Gestational Meal Planning Guidelines Limit fried foods Avoid fruit juices Avoid cold cereal for breakfast Complete a 3 Day Food Record and bring to next appointment Check blood sugars 4 x day - before breakfast and 2 hrs after every meal and record  Bring blood sugar log to all appointments Call MD for prescription for meter strips and lancets Strips Accu-Chek Guide Lancets   Accu-Chek FastClix Purchase urine ketone strips if ordered by MD and check urine ketones every am:  If + increase bedtime snack to 1 protein and 2 carbohydrate servings Walk 20-30 minutes at least 5 x week if permitted by MD

## 2019-10-01 NOTE — Progress Notes (Signed)
Diabetes Self-Management Education  Visit Type: First/Initial  Appt. Start Time: 0925 Appt. End Time: 1055  10/01/2019  [name], identified by name and date of birth, is a 23 y.o. female with a diagnosis of Diabetes: Gestational Diabetes.   ASSESSMENT  Blood pressure 100/70, height 5\' 4"  (1.626 m), weight 202 lb 9.6 oz (91.9 kg), last menstrual period 05/12/2019, estimated date of delivery 03/01/2020 Body mass index is 34.78 kg/m.  Diabetes Self-Management Education - 10/01/19 1440      Visit Information   Visit Type  First/Initial      Initial Visit   Diabetes Type  Gestational Diabetes    Are you currently following a meal plan?  No    Are you taking your medications as prescribed?  Yes    Date Diagnosed  last week      Health Coping   How would you rate your overall health?  Good      Psychosocial Assessment   Patient Belief/Attitude about Diabetes  --   "nervous"   Self-care barriers  None    Self-management support  Doctor's office;Friends    Patient Concerns  Nutrition/Meal planning;Glycemic Control;Healthy Lifestyle    Special Needs  None    Preferred Learning Style  Auditory;Visual;Hands on    Learning Readiness  Ready    How often do you need to have someone help you when you read instructions, pamphlets, or other written materials from your doctor or pharmacy?  1 - Never    What is the last grade level you completed in school?  high school      Pre-Education Assessment   Patient understands the diabetes disease and treatment process.  Needs Instruction    Patient understands incorporating nutritional management into lifestyle.  Needs Instruction    Patient undertands incorporating physical activity into lifestyle.  Needs Instruction    Patient understands using medications safely.  Needs Instruction    Patient understands monitoring blood glucose, interpreting and using results  Needs Instruction    Patient understands prevention, detection,  and treatment of acute complications.  Needs Instruction    Patient understands prevention, detection, and treatment of chronic complications.  Needs Instruction    Patient understands how to develop strategies to address psychosocial issues.  Needs Instruction    Patient understands how to develop strategies to promote health/change behavior.  Needs Instruction      Complications   Last HgB A1C per patient/outside source  6 %   07/19/2019   How often do you check your blood sugar?  0 times/day (not testing)   Provided Accu-Chek Guide Me meter and instructed on use. BG upon return demonstration was 100 mg/dL at 07/21/2019 am - fasting.   Have you had a dilated eye exam in the past 12 months?  No    Have you had a dental exam in the past 12 months?  Yes    Are you checking your feet?  No      Dietary Intake   Breakfast  cereal, milk, fruit ; eggs and cheese with toast; [place] chicken biscuit and fries    Lunch  fried or grilled chicken salad or left overs from supper    Snack (afternoon)  sometimes fruit (apple, orange, grapes, pears, pineapple, cantaloupe    Dinner  chicken, beef or steak; potaotes, rice, pasta, green beans, cauliflower, salads with lettuce, carrots, cuccumbers, occasional tomatoes    Beverage(s)  water, fruit juice      Exercise   Exercise  Type  ADL's      Patient Education   Previous Diabetes Education  No    Disease state   Definition of diabetes, type 1 and 2, and the diagnosis of diabetes;Factors that contribute to the development of diabetes    Nutrition management   Role of diet in the treatment of diabetes and the relationship between the three main macronutrients and blood glucose level;Food label reading, portion sizes and measuring food.;Reviewed blood glucose goals for pre and post meals and how to evaluate the patients' food intake on their blood glucose level.    Physical activity and exercise   Role of exercise on diabetes management, blood pressure control  and cardiac health.    Medications  Other (comment)   Limited use of oral medications during pregnancy and possible use of Insulin   Monitoring  Taught/evaluated SMBG meter.;Purpose and frequency of SMBG.;Taught/discussed recording of test results and interpretation of SMBG.;Ketone testing, when, how.    Chronic complications  Relationship between chronic complications and blood glucose control    Psychosocial adjustment  Identified and addressed patients feelings and concerns about diabetes    Preconception care  Pregnancy and GDM  Role of pre-pregnancy blood glucose control on the development of the fetus;Reviewed with patient blood glucose goals with pregnancy;Role of family planning for patients with diabetes      Individualized Goals (developed by patient)   Reducing Risk  Improve blood sugars Prevent diabetes complications Lead a healthier lifestyle Become more fit     Outcomes   Expected Outcomes  Demonstrated interest in learning. Expect positive outcomes    Future DMSE  2 wks       Individualized Plan for Diabetes Self-Management Training:   Learning Objective:  Patient will have a greater understanding of diabetes self-management. Patient education plan is to attend individual and/or group sessions per assessed needs and concerns.   Plan:   Patient Instructions  Read booklet on Gestational Diabetes Follow Gestational Meal Planning Guidelines Limit fried foods Avoid fruit juices Avoid cold cereal for breakfast Complete a 3 Day Food Record and bring to next appointment Check blood sugars 4 x day - before breakfast and 2 hrs after every meal and record  Bring blood sugar log to all appointments Call MD for prescription for meter strips and lancets Strips Accu-Chek Guide Lancets   Accu-Chek FastClix Purchase urine ketone strips if ordered by MD and check urine ketones every am:  If + increase bedtime snack to 1 protein and 2 carbohydrate servings Walk 20-30 minutes at  least 5 x week if permitted by MD  Expected Outcomes:  Demonstrated interest in learning. Expect positive outcomes  Education material provided:  Gestational Booklet Gestational Meal Planning Guidelines Simple Meal Plan Viewed Gestational Diabetes Video Meter = Accu-Chek Guide Me 3 Day Food Record Goals for a Healthy Pregnancy  If problems or questions, patient to contact team via:  Johny Drilling, RN, [place], CDE [phone]  Future DSME appointment: 2 wks  October 16, 2019 with the dietitian

## 2019-10-01 NOTE — Telephone Encounter (Signed)
The patient called and stated that she needs a prescription called in for the acu-check Pt is requesting a call back. Please advise.

## 2019-10-02 MED ORDER — ACCU-CHEK FASTCLIX LANCETS MISC: 12 refills | Status: DC

## 2019-10-02 MED ORDER — ACCU-CHEK GUIDE VI STRP: ORAL_STRIP | 12 refills | Status: DC

## 2019-10-02 NOTE — Telephone Encounter (Signed)
LM for patient that lancets and strips have been sent to the pharmacy.

## 2019-10-08 ENCOUNTER — Ambulatory Visit (INDEPENDENT_AMBULATORY_CARE_PROVIDER_SITE_OTHER): Payer: Medicaid Other

## 2019-10-08 ENCOUNTER — Ambulatory Visit (INDEPENDENT_AMBULATORY_CARE_PROVIDER_SITE_OTHER): Payer: Medicaid Other | Admitting: Obstetrics and Gynecology

## 2019-10-08 ENCOUNTER — Encounter: Payer: Self-pay | Admitting: Obstetrics and Gynecology

## 2019-10-08 ENCOUNTER — Other Ambulatory Visit: Payer: Self-pay

## 2019-10-08 VITALS — BP 101/66 | HR 73 | Wt 202.5 lb

## 2019-10-08 DIAGNOSIS — Z3A19 19 weeks gestation of pregnancy: Secondary | ICD-10-CM

## 2019-10-08 DIAGNOSIS — O24312 Unspecified pre-existing diabetes mellitus in pregnancy, second trimester: Secondary | ICD-10-CM

## 2019-10-08 DIAGNOSIS — Z87440 Personal history of urinary (tract) infections: Secondary | ICD-10-CM

## 2019-10-08 DIAGNOSIS — Z3492 Encounter for supervision of normal pregnancy, unspecified, second trimester: Secondary | ICD-10-CM

## 2019-10-08 DIAGNOSIS — Z3480 Encounter for supervision of other normal pregnancy, unspecified trimester: Secondary | ICD-10-CM

## 2019-10-08 LAB — POCT URINALYSIS DIPSTICK OB
Bilirubin, UA: NEGATIVE
Blood, UA: NEGATIVE
Glucose, UA: NEGATIVE
Ketones, UA: NEGATIVE
Leukocytes, UA: NEGATIVE
Nitrite, UA: POSITIVE
POC,PROTEIN,UA: NEGATIVE
Spec Grav, UA: <=1.005 — AB (ref 1.010–1.025)
Urobilinogen, UA: 0.2 E.U./dL
pH, UA: 7 (ref 5.0–8.0)

## 2019-10-08 NOTE — Progress Notes (Signed)
ROB-Pt present for routine prenatal care. Pt stated having lower back pain. Pt stopped taking medication Macrobid for UTI on Friday.

## 2019-10-08 NOTE — Progress Notes (Signed)
ROB: Doing well, no complaints.  Wonders why she keeps getting UTI's.  Has had possibly 2 thus far in pregnancy (or may have had 1 just inadequately treated by the time of subsequent visit).  Just completed course of antibiotics Friday. UA today still with nitrites.  Will repeat at next visit to ensure clearing of UTI. Patient with newly diagnosed DM early in pregnancy (likely pre-existing as early glucola and GTT abnormal). Completed nutritional consult last week. Is collecting blood sugars this week as she just got her meter. Will f/u at next visit. Anatomy scan incomplete, will return in 2-3 weeks. Plans to breastfeed, see education below.  RTC in 4 weeks.    The following were addressed during this visit:  Breastfeeding Education - The importance of exclusive breastfeeding    Comments: Provides antibodies, Lower risk of breast and ovarian cancers, and type-2 diabetes,Helps your body recover, Reduced chance of SIDS.   - Nonpharmacological pain relief methods for labor    Comments: Deep breathing, focusing on pleasant things, movement and walking, heating pads or cold compress, massage and relaxation, continuous support from someone you trust, and Doulas   - Exclusive breastfeeding for the first 6 months    Comments: Builds a healthy milk supply and keeps it up, protects baby from sickness and disease, and breastmilk has everything your baby needs for the first 6 months.

## 2019-10-16 ENCOUNTER — Encounter: Payer: Medicaid Other | Admitting: Dietician

## 2019-10-16 ENCOUNTER — Encounter: Payer: Self-pay | Admitting: Dietician

## 2019-10-16 ENCOUNTER — Other Ambulatory Visit: Payer: Self-pay

## 2019-10-16 VITALS — Ht 64.0 in | Wt 201.6 lb

## 2019-10-16 DIAGNOSIS — O24410 Gestational diabetes mellitus in pregnancy, diet controlled: Secondary | ICD-10-CM

## 2019-10-16 DIAGNOSIS — O24419 Gestational diabetes mellitus in pregnancy, unspecified control: Secondary | ICD-10-CM | POA: Diagnosis not present

## 2019-10-16 DIAGNOSIS — Z3A00 Weeks of gestation of pregnancy not specified: Secondary | ICD-10-CM | POA: Diagnosis not present

## 2019-10-16 NOTE — Progress Notes (Signed)
.   Patient's verbal BG report indicates fasting BGs ranging 97-113 (113 after a late dinner), and post-meal BGs ranging 81-113.  Marland Kitchen Patient's dietary recall indicates balanced meals and controlled carbohydrate intake. She seems to be making appropriate snack choices, usually eats 2 snacks daily. She is not typically snacking at night.  . Provided balanced meal plan, and wrote individualized menus based on patient's food preferences. . Instructed on basic treatment for low BGs, and possibility for low BG reactions if she requires medication to treat elevated fasting BGs, or during/ after exercise.   . Instructed patient on food safety, including avoidance of Listeriosis, and limiting mercury from fish. . Discussed importance of maintaining healthy lifestyle habits to reduce risk of Type 2 DM as well as Gestational DM with any future pregnancies. . Advised patient to use any remaining testing supplies to test some BGs after delivery, and to have BG tested ideally annually, as well as prior to attempting future pregnancies.

## 2019-10-16 NOTE — Patient Instructions (Signed)
   Monitor meal times and activity level in relation to fasting blood sugars.  Good job eating healthy, balanced meals, keep it up! You can increase portions of proteins or low-carb veggies to feel full if needed.   Keep a small amount of juice or chewable candy with you when walking in case sugar drops low.

## 2019-10-23 ENCOUNTER — Other Ambulatory Visit: Payer: Medicaid Other

## 2019-10-24 ENCOUNTER — Other Ambulatory Visit: Payer: Medicaid Other

## 2019-10-30 ENCOUNTER — Other Ambulatory Visit: Payer: Medicaid Other

## 2019-10-30 ENCOUNTER — Ambulatory Visit (INDEPENDENT_AMBULATORY_CARE_PROVIDER_SITE_OTHER): Payer: Medicaid Other

## 2019-10-30 ENCOUNTER — Other Ambulatory Visit: Payer: Self-pay

## 2019-10-30 DIAGNOSIS — Z3480 Encounter for supervision of other normal pregnancy, unspecified trimester: Secondary | ICD-10-CM

## 2019-11-05 ENCOUNTER — Encounter: Payer: Medicaid Other | Admitting: Obstetrics and Gynecology

## 2019-11-05 ENCOUNTER — Ambulatory Visit (INDEPENDENT_AMBULATORY_CARE_PROVIDER_SITE_OTHER): Payer: Medicaid Other | Admitting: Obstetrics and Gynecology

## 2019-11-05 ENCOUNTER — Encounter: Payer: Self-pay | Admitting: Obstetrics and Gynecology

## 2019-11-05 ENCOUNTER — Other Ambulatory Visit: Payer: Self-pay

## 2019-11-05 VITALS — BP 95/64 | HR 73 | Wt 203.1 lb

## 2019-11-05 DIAGNOSIS — Z87440 Personal history of urinary (tract) infections: Secondary | ICD-10-CM

## 2019-11-05 DIAGNOSIS — O24312 Unspecified pre-existing diabetes mellitus in pregnancy, second trimester: Secondary | ICD-10-CM

## 2019-11-05 DIAGNOSIS — Z3492 Encounter for supervision of normal pregnancy, unspecified, second trimester: Secondary | ICD-10-CM | POA: Diagnosis not present

## 2019-11-05 DIAGNOSIS — O368390 Maternal care for abnormalities of the fetal heart rate or rhythm, unspecified trimester, not applicable or unspecified: Secondary | ICD-10-CM

## 2019-11-05 DIAGNOSIS — Z3A23 23 weeks gestation of pregnancy: Secondary | ICD-10-CM

## 2019-11-05 LAB — POCT URINALYSIS DIPSTICK OB
Bilirubin, UA: NEGATIVE
Blood, UA: NEGATIVE
Glucose, UA: NEGATIVE
Ketones, UA: NEGATIVE
Leukocytes, UA: NEGATIVE
Nitrite, UA: NEGATIVE
POC,PROTEIN,UA: NEGATIVE
Spec Grav, UA: 1.01 (ref 1.010–1.025)
Urobilinogen, UA: 0.2 E.U./dL
pH, UA: 7 (ref 5.0–8.0)

## 2019-11-05 NOTE — Progress Notes (Signed)
ROB:  Urine sent or C&S.  Sugars reviewed but patient did not bring her meter or her sugar log.  She reports that her fastings are often elevated but her postprandials are all in the normal range..  Now feeling daily fetal movement. Irregularly irregular fetal heart tones heard.  Skipping beats every 2-3 beats.  Ultrasound results reviewed-normal structural heart noted.  With the patient's history of diabetes and fetal arrhythmia will send patient for perinatal consult and possible fetal echo.  Discussed in detail with patient.

## 2019-11-05 NOTE — Progress Notes (Signed)
Patient comes in today for [place] visit. She has no concerns today. She will call back with the sample name of prenatal so we can send to pharmacy.

## 2019-11-07 NOTE — Progress Notes (Signed)
[facility] received a referral from Dr. Amalia Hailey at [facility] today for Urgent Consult for DM in Pregnancy and Fetal Arrhythmia.  Dr. Diamantina Monks reviewed records and would like pt to have a Fetal  Echo in [facility] or [place].  Consult request faxed to [facility] location for referral request at [phone] on 11/07/19 and was received via fax confirmation.

## 2019-11-08 LAB — URINE CULTURE

## 2019-11-11 DIAGNOSIS — Z3A24 24 weeks gestation of pregnancy: Secondary | ICD-10-CM | POA: Diagnosis not present

## 2019-11-12 ENCOUNTER — Other Ambulatory Visit: Payer: Medicaid Other

## 2019-11-12 ENCOUNTER — Encounter: Payer: Self-pay | Admitting: Obstetrics and Gynecology

## 2019-11-12 ENCOUNTER — Telehealth: Payer: Self-pay

## 2019-11-12 ENCOUNTER — Encounter: Payer: Medicaid Other | Admitting: Obstetrics and Gynecology

## 2019-11-12 DIAGNOSIS — O368390 Maternal care for abnormalities of the fetal heart rate or rhythm, unspecified trimester, not applicable or unspecified: Secondary | ICD-10-CM | POA: Insufficient documentation

## 2019-11-12 DIAGNOSIS — O09899 Supervision of other high risk pregnancies, unspecified trimester: Secondary | ICD-10-CM | POA: Insufficient documentation

## 2019-11-13 ENCOUNTER — Other Ambulatory Visit: Payer: Self-pay | Admitting: Surgical

## 2019-11-13 ENCOUNTER — Telehealth: Payer: Self-pay | Admitting: Surgical

## 2019-11-13 MED ORDER — NITROFURANTOIN MONOHYD MACRO 100 MG PO CAPS: 100.0000 mg | ORAL_CAPSULE | Freq: Two times a day (BID) | ORAL | 0 refills | Status: DC

## 2019-11-13 NOTE — Telephone Encounter (Signed)
ERROR

## 2019-11-13 NOTE — Telephone Encounter (Signed)
Called the interpreter service to reach out to patient. They left message for patient to return call to the office. Per Dr. Amalia Hailey patient needs to be seen weekly for the next 4 weeks. They can be scheduled with Dr. Amalia Hailey or Dr. Marcelline Mates. I have also sent in ABX to the pharmacy for a UTI.

## 2019-11-22 NOTE — L&D Delivery Note (Signed)
       Delivery Note   Mallory Shields is a 24 y.o. [id number] at [redacted]w[redacted]d Estimated Date of Delivery: 03/01/20  PRE-OPERATIVE DIAGNOSIS:  1) [redacted]w[redacted]d pregnancy.  2) single umbilical artery 3) category 2 external fetal monitor 4) early labor   POST-OPERATIVE DIAGNOSIS:  1) [redacted]w[redacted]d pregnancy s/p Vaginal, Spontaneous  2) same as admission with:  Meconium  Nuchal cord x2  True knot in umbilical cord  Delivery Type: Vaginal, Spontaneous    Delivery Anesthesia: Epidural   Labor Complications:      ESTIMATED BLOOD LOSS: 75  ml    FINDINGS:   1) female infant, Apgar scores of 7   at 1 minute and 9   at 5 minutes and a birthweight of 122.75  ounces.    2) Nuchal cord:  X2 with true knot  SPECIMENS:   PLACENTA:   Appearance: Intact    Removal: Spontaneous      Disposition:    DISPOSITION:  Infant to left in stable condition in the delivery room, with L&D personnel and mother,  NARRATIVE SUMMARY: Labor course:  [name] is a [id number] at [redacted]w[redacted]d who presented for irregular contractions.  She was found to have a category 2 external fetal monitor strip.  She also underwent some cervical change with irregular contractions.  She was admitted for early labor and induction for diabetes and category 2 monitor.  50 mcg of misoprostol was placed intravaginally and she began contracting more regularly and forcefully.  She progressed well in labor without pitocin.  She received the appropriate anesthesia and proceeded to complete dilation.  Once the patient reached 10 cm the fetal heart rate dropped with rapid descent of the fetal vertex.  Fetal heart rate was in the 80s and 90s.  The patient pushed through 2 contractions with good maternal expulsive effort during the second stage. She went on to deliver a viable infant. The placenta delivered without problems and was noted to be complete. A perineal and vaginal examination was performed. Episiotomy/Lacerations: None   Elonda Husky,  M.D. 02/25/2020 2:00 PM

## 2019-12-03 ENCOUNTER — Encounter: Payer: Self-pay | Admitting: Obstetrics and Gynecology

## 2019-12-03 ENCOUNTER — Other Ambulatory Visit: Payer: Self-pay

## 2019-12-03 ENCOUNTER — Ambulatory Visit (INDEPENDENT_AMBULATORY_CARE_PROVIDER_SITE_OTHER): Payer: Medicaid Other | Admitting: Obstetrics and Gynecology

## 2019-12-03 VITALS — BP 104/67 | HR 84 | Wt 201.6 lb

## 2019-12-03 DIAGNOSIS — O368320 Maternal care for abnormalities of the fetal heart rate or rhythm, second trimester, not applicable or unspecified: Secondary | ICD-10-CM

## 2019-12-03 DIAGNOSIS — Z87440 Personal history of urinary (tract) infections: Secondary | ICD-10-CM

## 2019-12-03 DIAGNOSIS — O24319 Unspecified pre-existing diabetes mellitus in pregnancy, unspecified trimester: Secondary | ICD-10-CM | POA: Insufficient documentation

## 2019-12-03 DIAGNOSIS — O09899 Supervision of other high risk pregnancies, unspecified trimester: Secondary | ICD-10-CM

## 2019-12-03 DIAGNOSIS — Z3482 Encounter for supervision of other normal pregnancy, second trimester: Secondary | ICD-10-CM

## 2019-12-03 DIAGNOSIS — O368390 Maternal care for abnormalities of the fetal heart rate or rhythm, unspecified trimester, not applicable or unspecified: Secondary | ICD-10-CM

## 2019-12-03 DIAGNOSIS — Z23 Encounter for immunization: Secondary | ICD-10-CM | POA: Diagnosis not present

## 2019-12-03 DIAGNOSIS — Z3A27 27 weeks gestation of pregnancy: Secondary | ICD-10-CM

## 2019-12-03 DIAGNOSIS — O09892 Supervision of other high risk pregnancies, second trimester: Secondary | ICD-10-CM

## 2019-12-03 DIAGNOSIS — Z130 Encounter for screening for diseases of the blood and blood-forming organs and certain disorders involving the immune mechanism: Secondary | ICD-10-CM

## 2019-12-03 LAB — POCT URINALYSIS DIPSTICK OB
Bilirubin, UA: NEGATIVE
Blood, UA: NEGATIVE
Glucose, UA: NEGATIVE
Ketones, UA: NEGATIVE
Nitrite, UA: NEGATIVE
POC,PROTEIN,UA: NEGATIVE
Spec Grav, UA: 1.01 (ref 1.010–1.025)
Urobilinogen, UA: 0.2 E.U./dL
pH, UA: 6.5 (ref 5.0–8.0)

## 2019-12-03 MED ORDER — TETANUS-DIPHTH-ACELL PERTUSSIS 5-2.5-18.5 LF-MCG/0.5 IM SUSP
0.5000 mL | Freq: Once | INTRAMUSCULAR | Status: AC
  Administered 2019-12-03: 16:00:00 0.5 mL via INTRAMUSCULAR

## 2019-12-03 NOTE — Progress Notes (Signed)
ROB: Doing well, no major issues.  For 28 week labs today.  Desires to btreastfeed, desires unsure method for contraception (possibly condoms). For Tdap today, signed blood consent, Ready Set Baby info given.  Is s/p [facility] consult with fetal echo due to arrhythmia heard last visit. Fetal echo overall normal with isolated PAC's.  No arrhythmia heard on today's doppler.  Also noted single umbilical artery on scan. Discussed implications in pregnancy.  Did not have genetic testing. Reviewed blood sugars, fastings borderline (95-101), postprandials normal. Discussed further dietary modifications for evening meals, increasing physical activity in the evenings.

## 2019-12-03 NOTE — Lactation Note (Signed)
Lactation Consultation Note  Patient Name: Britainy Kozub Today's Date: 12/03/2019     Maternal Data    Feeding    LATCH Score                   Interventions    Lactation Tools Discussed/Used     Consult Status  Lactation consultant discussed breastfeeding information with [facility] per Ready, Set, Curriculum. She states that she plans on breastfeeding for longer than her first child. LC reviewed benefits of breastfeeding with Andrey Campanile and she denies any questions at this time.     Arlyss Gandy 12/03/2019, 4:41 PM

## 2019-12-03 NOTE — Patient Instructions (Signed)

## 2019-12-03 NOTE — Progress Notes (Signed)
ROB-Pt present for routine prenatal care and 28 week vacc and signed BTC.  Pt stated that she was doing well no problems.

## 2019-12-04 LAB — CBC
Hematocrit: 32.6 % — ABNORMAL LOW (ref 34.0–46.6)
Hemoglobin: 11 g/dL — ABNORMAL LOW (ref 11.1–15.9)
MCH: 27.2 pg (ref 26.6–33.0)
MCHC: 33.7 g/dL (ref 31.5–35.7)
MCV: 81 fL (ref 79–97)
Platelets: 290 10*3/uL (ref 150–450)
RBC: 4.04 x10E6/uL (ref 3.77–5.28)
RDW: 13.3 % (ref 11.7–15.4)
WBC: 6.9 10*3/uL (ref 3.4–10.8)

## 2019-12-04 LAB — RPR: RPR Ser Ql: NONREACTIVE

## 2019-12-12 DIAGNOSIS — O368320 Maternal care for abnormalities of the fetal heart rate or rhythm, second trimester, not applicable or unspecified: Secondary | ICD-10-CM | POA: Diagnosis not present

## 2019-12-12 DIAGNOSIS — Z3A24 24 weeks gestation of pregnancy: Secondary | ICD-10-CM | POA: Diagnosis not present

## 2019-12-17 ENCOUNTER — Telehealth: Payer: Self-pay

## 2019-12-17 ENCOUNTER — Encounter: Payer: Medicaid Other | Admitting: Obstetrics and Gynecology

## 2019-12-17 NOTE — Telephone Encounter (Signed)
Dejuana spoke with patient and she took the antibiotic back in December when lab was abnormal. No abnormal urine since then.

## 2019-12-17 NOTE — Telephone Encounter (Signed)
Pt says she saw on my chart that she may have a UTI. Please call patient

## 2019-12-31 ENCOUNTER — Ambulatory Visit (INDEPENDENT_AMBULATORY_CARE_PROVIDER_SITE_OTHER): Payer: Medicaid Other | Admitting: Obstetrics and Gynecology

## 2019-12-31 ENCOUNTER — Other Ambulatory Visit: Payer: Self-pay

## 2019-12-31 VITALS — BP 109/73 | HR 84 | Wt 200.0 lb

## 2019-12-31 DIAGNOSIS — Z3482 Encounter for supervision of other normal pregnancy, second trimester: Secondary | ICD-10-CM

## 2019-12-31 DIAGNOSIS — O09899 Supervision of other high risk pregnancies, unspecified trimester: Secondary | ICD-10-CM

## 2019-12-31 DIAGNOSIS — O24319 Unspecified pre-existing diabetes mellitus in pregnancy, unspecified trimester: Secondary | ICD-10-CM

## 2019-12-31 DIAGNOSIS — N390 Urinary tract infection, site not specified: Secondary | ICD-10-CM

## 2019-12-31 LAB — POCT URINALYSIS DIPSTICK OB
Bilirubin, UA: NEGATIVE
Blood, UA: NEGATIVE
Glucose, UA: NEGATIVE
Ketones, UA: NEGATIVE
Nitrite, UA: POSITIVE
Spec Grav, UA: 1.01 (ref 1.010–1.025)
Urobilinogen, UA: 0.2 E.U./dL
pH, UA: 8 (ref 5.0–8.0)

## 2019-12-31 MED ORDER — NITROFURANTOIN MONOHYD MACRO 100 MG PO CAPS: 100.0000 mg | ORAL_CAPSULE | ORAL | 2 refills | Status: AC

## 2019-12-31 MED ORDER — NITROFURANTOIN MONOHYD MACRO 100 MG PO CAPS: 100.0000 mg | ORAL_CAPSULE | Freq: Two times a day (BID) | ORAL | 1 refills | Status: DC

## 2019-12-31 NOTE — Progress Notes (Signed)
ROB: She has no complaints.  She has had her second echo and the baby no longer is skipping beats.  She requires no further appointments with MFM.  They would like her seen every 2 weeks unless an arrhythmia is detected.  No arrhythmia today.  Patient doing a very good job keeping her sugar log.  Fastings hovering around 100 but all postprandials excellent.  This is diet controlled.  Patient is urine dip reveals probable UTI again.  Patient not taking any suppression.  We will treat with 1 week of Macrobid followed by daily suppression for the remainder of the pregnancy.

## 2020-01-14 ENCOUNTER — Encounter: Payer: Medicaid Other | Admitting: Certified Nurse Midwife

## 2020-01-16 ENCOUNTER — Encounter: Payer: Medicaid Other | Admitting: Obstetrics and Gynecology

## 2020-01-16 ENCOUNTER — Telehealth: Payer: Self-pay | Admitting: Obstetrics and Gynecology

## 2020-01-16 NOTE — Telephone Encounter (Signed)
Pt called in and stated that she missed her appt bc she was exposed to covid and doesn't feel good. This pts appt was pushed back the pt is getting tested and we moved her appt. I advised the pt to call us if the test is positive. Pt is requesting a call back..  Please advise

## 2020-01-17 DIAGNOSIS — Z20822 Contact with and (suspected) exposure to covid-19: Secondary | ICD-10-CM | POA: Diagnosis not present

## 2020-01-17 NOTE — Telephone Encounter (Signed)
Spoke with pt concerning her sxs and did she get tested. Pt stated that she missed her appointment for covid testing but was having sore throat, no fever, lost of taste and smell. Pt was sent a safe medication list via mychart. Pt was advised to stay hydrated, drink plenty of water, take cold medication, use humidifier, and stay activity. Pt was advised that if her symptoms increase or if she has a fever, sob differently breathing to please go the ED. Pt stated that she understood.

## 2020-01-23 ENCOUNTER — Encounter: Payer: Medicaid Other | Admitting: Obstetrics and Gynecology

## 2020-01-31 ENCOUNTER — Ambulatory Visit (INDEPENDENT_AMBULATORY_CARE_PROVIDER_SITE_OTHER): Payer: Medicaid Other | Admitting: Obstetrics and Gynecology

## 2020-01-31 ENCOUNTER — Other Ambulatory Visit: Payer: Self-pay

## 2020-01-31 ENCOUNTER — Encounter: Payer: Self-pay | Admitting: Obstetrics and Gynecology

## 2020-01-31 VITALS — BP 102/67 | HR 66 | Wt 202.5 lb

## 2020-01-31 DIAGNOSIS — O2343 Unspecified infection of urinary tract in pregnancy, third trimester: Secondary | ICD-10-CM

## 2020-01-31 DIAGNOSIS — O24313 Unspecified pre-existing diabetes mellitus in pregnancy, third trimester: Secondary | ICD-10-CM

## 2020-01-31 DIAGNOSIS — Z87440 Personal history of urinary (tract) infections: Secondary | ICD-10-CM

## 2020-01-31 DIAGNOSIS — Z113 Encounter for screening for infections with a predominantly sexual mode of transmission: Secondary | ICD-10-CM

## 2020-01-31 DIAGNOSIS — Z3A35 35 weeks gestation of pregnancy: Secondary | ICD-10-CM

## 2020-01-31 DIAGNOSIS — Z3483 Encounter for supervision of other normal pregnancy, third trimester: Secondary | ICD-10-CM

## 2020-01-31 DIAGNOSIS — O98513 Other viral diseases complicating pregnancy, third trimester: Secondary | ICD-10-CM

## 2020-01-31 DIAGNOSIS — O24319 Unspecified pre-existing diabetes mellitus in pregnancy, unspecified trimester: Secondary | ICD-10-CM | POA: Diagnosis not present

## 2020-01-31 DIAGNOSIS — O09893 Supervision of other high risk pregnancies, third trimester: Secondary | ICD-10-CM

## 2020-01-31 DIAGNOSIS — U071 COVID-19: Secondary | ICD-10-CM

## 2020-01-31 DIAGNOSIS — Z3685 Encounter for antenatal screening for Streptococcus B: Secondary | ICD-10-CM

## 2020-01-31 LAB — POCT URINALYSIS DIPSTICK OB
Bilirubin, UA: NEGATIVE
Glucose, UA: NEGATIVE
Ketones, UA: NEGATIVE
Spec Grav, UA: 1.02 (ref 1.010–1.025)
Urobilinogen, UA: 0.2 E.U./dL
pH, UA: 6.5 (ref 5.0–8.0)

## 2020-01-31 MED ORDER — CEFTRIAXONE SODIUM 1 G IJ SOLR
1.0000 g | Freq: Once | INTRAMUSCULAR | Status: AC
  Administered 2020-01-31: 1 g via INTRAMUSCULAR

## 2020-01-31 MED ORDER — NITROFURANTOIN MONOHYD MACRO 100 MG PO CAPS: ORAL_CAPSULE | ORAL | 1 refills | Status: DC

## 2020-01-31 NOTE — Progress Notes (Signed)
ROB: Patient missed last appointment due to COVID infection. Patient notes she is overall doing well. She complains of increased pressure. UA with nitrites again, patient reports completing last round of antibiotics in February but was not given the prescription for suppression. Will treat again with Macrobid, followed by suppression. Will also give dose of Rocephin in office today due to h/o recurrent UTI's. Urine culture ordered. 36 week labs done today.  Plans to breastfeed. Condoms for contraception. 36 week cultures done.  Patient forgot glucose log.  Reminded to bring each week.  Will check A1c. For growth scan next visit with BPP, followed by weekly NSTs.

## 2020-01-31 NOTE — Progress Notes (Signed)
ROB-Pt present for routine prenatal care. Pt c/o back pain and pelvic pressure. UA completed positive for Nit cultures ordered. Pt finished Macrobid the last week of Feb for UTI. 36 week cultures done.

## 2020-02-01 LAB — HEMOGLOBIN A1C
Est. average glucose Bld gHb Est-mCnc: 134 mg/dL
Hgb A1c MFr Bld: 6.3 % — ABNORMAL HIGH (ref 4.8–5.6)

## 2020-02-02 LAB — STREP GP B NAA: Strep Gp B NAA: POSITIVE — AB

## 2020-02-04 LAB — URINE CULTURE

## 2020-02-04 LAB — GC/CHLAMYDIA PROBE AMP
Chlamydia trachomatis, NAA: NEGATIVE
Neisseria Gonorrhoeae by PCR: NEGATIVE

## 2020-02-12 ENCOUNTER — Ambulatory Visit (INDEPENDENT_AMBULATORY_CARE_PROVIDER_SITE_OTHER): Payer: Medicaid Other

## 2020-02-12 ENCOUNTER — Other Ambulatory Visit: Payer: Medicaid Other

## 2020-02-12 ENCOUNTER — Ambulatory Visit (INDEPENDENT_AMBULATORY_CARE_PROVIDER_SITE_OTHER): Payer: Medicaid Other | Admitting: Obstetrics and Gynecology

## 2020-02-12 ENCOUNTER — Encounter: Payer: Self-pay | Admitting: Obstetrics and Gynecology

## 2020-02-12 ENCOUNTER — Other Ambulatory Visit: Payer: Self-pay

## 2020-02-12 VITALS — BP 109/71 | HR 76 | Wt 203.2 lb

## 2020-02-12 DIAGNOSIS — O24313 Unspecified pre-existing diabetes mellitus in pregnancy, third trimester: Secondary | ICD-10-CM | POA: Diagnosis not present

## 2020-02-12 DIAGNOSIS — Z3483 Encounter for supervision of other normal pregnancy, third trimester: Secondary | ICD-10-CM

## 2020-02-12 DIAGNOSIS — O24319 Unspecified pre-existing diabetes mellitus in pregnancy, unspecified trimester: Secondary | ICD-10-CM

## 2020-02-12 DIAGNOSIS — Z362 Encounter for other antenatal screening follow-up: Secondary | ICD-10-CM | POA: Diagnosis not present

## 2020-02-12 DIAGNOSIS — Z3A37 37 weeks gestation of pregnancy: Secondary | ICD-10-CM

## 2020-02-12 DIAGNOSIS — Z3A38 38 weeks gestation of pregnancy: Secondary | ICD-10-CM

## 2020-02-12 LAB — POCT URINALYSIS DIPSTICK OB
Bilirubin, UA: NEGATIVE
Blood, UA: NEGATIVE
Glucose, UA: NEGATIVE
Ketones, UA: NEGATIVE
Nitrite, UA: NEGATIVE
POC,PROTEIN,UA: NEGATIVE
Spec Grav, UA: 1.01 (ref 1.010–1.025)
Urobilinogen, UA: 0.2 E.U./dL
pH, UA: 7 (ref 5.0–8.0)

## 2020-02-12 NOTE — Progress Notes (Signed)
Patient comes in today for ROB visit. Patient stated that she was having some back pain yesterday.

## 2020-02-12 NOTE — Progress Notes (Addendum)
ROB: Patient has no complaints.  Ultrasound today reviewed with patient.  BPP 8/8 -fetal growth 46 percentile.  Patient asymptomatic with previous UTI.  Treated multiple times and currently on Macrobid suppression.  Patient brought her sugar log and 3 weeks of sugars reveal 0 abnormals despite her slight increase in hemoglobin A1c during that time.  Patient had Covid 3 weeks ago-has no remaining symptoms.  NST with next visit.

## 2020-02-18 NOTE — Progress Notes (Signed)
ROB-Pt present for routine prenatal care. Pt c/o lower pelvic pressure and swollen feet.

## 2020-02-19 ENCOUNTER — Encounter: Payer: Self-pay | Admitting: Obstetrics and Gynecology

## 2020-02-19 ENCOUNTER — Other Ambulatory Visit: Payer: Self-pay

## 2020-02-19 ENCOUNTER — Ambulatory Visit (INDEPENDENT_AMBULATORY_CARE_PROVIDER_SITE_OTHER): Payer: Medicaid Other | Admitting: Obstetrics and Gynecology

## 2020-02-19 VITALS — BP 101/64 | HR 81 | Wt 204.2 lb

## 2020-02-19 DIAGNOSIS — O24313 Unspecified pre-existing diabetes mellitus in pregnancy, third trimester: Secondary | ICD-10-CM | POA: Diagnosis not present

## 2020-02-19 DIAGNOSIS — O09899 Supervision of other high risk pregnancies, unspecified trimester: Secondary | ICD-10-CM

## 2020-02-19 DIAGNOSIS — O09893 Supervision of other high risk pregnancies, third trimester: Secondary | ICD-10-CM

## 2020-02-19 DIAGNOSIS — Z3483 Encounter for supervision of other normal pregnancy, third trimester: Secondary | ICD-10-CM

## 2020-02-19 DIAGNOSIS — O24319 Unspecified pre-existing diabetes mellitus in pregnancy, unspecified trimester: Secondary | ICD-10-CM

## 2020-02-19 DIAGNOSIS — Z3A38 38 weeks gestation of pregnancy: Secondary | ICD-10-CM | POA: Diagnosis not present

## 2020-02-19 LAB — POCT URINALYSIS DIPSTICK OB
Bilirubin, UA: NEGATIVE
Blood, UA: NEGATIVE
Glucose, UA: NEGATIVE
Ketones, UA: NEGATIVE
Nitrite, UA: NEGATIVE
POC,PROTEIN,UA: NEGATIVE
Spec Grav, UA: 1.01 (ref 1.010–1.025)
Urobilinogen, UA: 0.2 E.U./dL
pH, UA: 7 (ref 5.0–8.0)

## 2020-02-19 NOTE — Progress Notes (Signed)
ROB: Patient doing well. Notes some pelvic pressure.  Discussed need for IOL secondary to concerns of diabetes control with recent elevated A1c (despite normal reported glucose values on sugar log). Will schedule for IOL at 39 weeks (Wednesday, 4/7 at 5 AM). Discussed procedure of induction, need for COVID testing.    NONSTRESS TEST INTERPRETATION  INDICATIONS: Type II DM (no meds)  FHR baseline: 145 bpm RESULTS:Reactive COMMENTS: uterine irritability   PLAN: 1. Continue fetal kick counts twice a day. 2. Scheduled for IOL 02/26/2020.

## 2020-02-19 NOTE — Patient Instructions (Addendum)
COVID 19 Instructions for Scheduled Procedure (Inductions/C-sections and GYN surgeries)   Thank you for choosing [facility] for your services.  You have been scheduled for a procedure called ________Induction of Labor____________________.    Your procedure is scheduled on ___________Wednesday, 4/7/2021_____________________.  You are required to have COVID-19 testing performed 2 days prior to your scheduled procedure date.  Testing is performed between 9 and 10 AM Monday through Friday.  Please present for testing on ____Monday, 4/5/2021____________ during this hour. Drive up testing is performed in front of [facility] (this is next to the [employer]).    Upon your scheduled procedure date, you will need to arrive at the [facility] entrance. (There is a statue at the front of this entrance.)   Please arrive on time if you are scheduled for an induction of labor.   If you are scheduled for a Cesarean delivery or for Gyn Surgery, arrive 2 hours prior to your procedure time.   If you are an Obstetric patient and your arrival time falls between 11 PM and 6 AM call L&D [phone]) when you arrive.  A staff member will meet you at the [facility] entrance.  At this time, patients are allowed 1 support person to accompany them. Face masks are required for you and your support person. Your support person is now allowed to be there with you during the entire time of your admission.   Please contact the office if you have any questions regarding this information.  The [facility] office number is [phone].     Thank you,    Your [facility] Providers       Labor Induction  Labor induction is when steps are taken to cause a pregnant woman to begin the labor process. Most women go into labor on their own between 37 weeks and 42 weeks of pregnancy. When this does not happen or when there is a medical need for labor to begin, steps may be taken to induce  labor. Labor induction causes a pregnant woman's uterus to contract. It also causes the cervix to soften (ripen), open (dilate), and thin out (efface). Usually, labor is not induced before 39 weeks of pregnancy unless there is a medical reason to do so. Your health care provider will determine if labor induction is needed. Before inducing labor, your health care provider will consider a number of factors, including:  Your medical condition and your baby's.  How many weeks along you are in your pregnancy.  How mature your baby's lungs are.  The condition of your cervix.  The position of your baby.  The size of your birth canal. What are some reasons for labor induction? Labor may be induced if:  Your health or your baby's health is at risk.  Your pregnancy is overdue by 1 week or more.  Your water breaks but labor does not start on its own.  There is a low amount of amniotic fluid around your baby. You may also choose (elect) to have labor induced at a certain time. Generally, elective labor induction is done no earlier than 39 weeks of pregnancy. What methods are used for labor induction? Methods used for labor induction include:  Prostaglandin medicine. This medicine starts contractions and causes the cervix to dilate and ripen. It can be taken by mouth (orally) or by being inserted into the vagina (suppository).  Inserting a small, thin tube (catheter) with a balloon into the vagina and then expanding  the balloon with water to dilate the cervix.  Stripping the membranes. In this method, your health care provider gently separates amniotic sac tissue from the cervix. This causes the cervix to stretch, which in turn causes the release of a hormone called progesterone. The hormone causes the uterus to contract. This procedure is often done during an office visit, after which you will be sent home to wait for contractions to begin.  Breaking the water. In this method, your health care  provider uses a small instrument to make a small hole in the amniotic sac. This eventually causes the amniotic sac to break. Contractions should begin after a few hours.  Medicine to trigger or strengthen contractions. This medicine is given through an IV that is inserted into a vein in your arm. Except for membrane stripping, which can be done in a clinic, labor induction is done in the hospital so that you and your baby can be carefully monitored. How long does it take for labor to be induced? The length of time it takes to induce labor depends on how ready your body is for labor. Some inductions can take up to 2-3 days, while others may take less than a day. Induction may take longer if:  You are induced early in your pregnancy.  It is your first pregnancy.  Your cervix is not ready. What are some risks associated with labor induction? Some risks associated with labor induction include:  Changes in fetal heart rate, such as being too high, too low, or irregular (erratic).  Failed induction.  Infection in the mother or the baby.  Increased risk of having a cesarean delivery.  Fetal death.  Breaking off (abruption) of the placenta from the uterus (rare).  Rupture of the uterus (very rare). When induction is needed for medical reasons, the benefits of induction generally outweigh the risks. What are some reasons for not inducing labor? Labor induction should not be done if:  Your baby does not tolerate contractions.  You have had previous surgeries on your uterus, such as a myomectomy, removal of fibroids, or a vertical scar from a previous cesarean delivery.  Your placenta lies very low in your uterus and blocks the opening of the cervix (placenta previa).  Your baby is not in a head-down position.  The umbilical cord drops down into the birth canal in front of the baby.  There are unusual circumstances, such as the baby being very early (premature).  You have had more  than 2 previous cesarean deliveries. Summary  Labor induction is when steps are taken to cause a pregnant woman to begin the labor process.  Labor induction causes a pregnant woman's uterus to contract. It also causes the cervix to ripen, dilate, and efface.  Labor is not induced before 39 weeks of pregnancy unless there is a medical reason to do so.  When induction is needed for medical reasons, the benefits of induction generally outweigh the risks. This information is not intended to replace advice given to you by your health care provider. Make sure you discuss any questions you have with your health care provider. Document Revised: 11/10/2017 Document Reviewed: 12/21/2016 Elsevier Patient Education  2020 [employer].

## 2020-02-25 ENCOUNTER — Other Ambulatory Visit: Payer: Self-pay

## 2020-02-25 ENCOUNTER — Inpatient Hospital Stay: Payer: Medicaid Other | Admitting: Anesthesiology

## 2020-02-25 ENCOUNTER — Inpatient Hospital Stay
Admission: EM | Admit: 2020-02-25 | Discharge: 2020-02-26 | DRG: 807 | Disposition: A | Discharge status: Home / Self Care | Payer: Medicaid Other | Attending: Obstetrics and Gynecology | Admitting: Obstetrics and Gynecology

## 2020-02-25 ENCOUNTER — Encounter: Payer: Self-pay | Admitting: Obstetrics and Gynecology

## 2020-02-25 DIAGNOSIS — O2412 Pre-existing diabetes mellitus, type 2, in childbirth: Secondary | ICD-10-CM | POA: Diagnosis present

## 2020-02-25 DIAGNOSIS — O24319 Unspecified pre-existing diabetes mellitus in pregnancy, unspecified trimester: Secondary | ICD-10-CM | POA: Diagnosis present

## 2020-02-25 DIAGNOSIS — Z3A00 Weeks of gestation of pregnancy not specified: Secondary | ICD-10-CM | POA: Diagnosis not present

## 2020-02-25 DIAGNOSIS — O99214 Obesity complicating childbirth: Secondary | ICD-10-CM | POA: Diagnosis present

## 2020-02-25 DIAGNOSIS — E119 Type 2 diabetes mellitus without complications: Secondary | ICD-10-CM | POA: Diagnosis present

## 2020-02-25 DIAGNOSIS — Z8616 Personal history of COVID-19: Secondary | ICD-10-CM | POA: Diagnosis not present

## 2020-02-25 DIAGNOSIS — E669 Obesity, unspecified: Secondary | ICD-10-CM | POA: Diagnosis present

## 2020-02-25 DIAGNOSIS — Z3A39 39 weeks gestation of pregnancy: Secondary | ICD-10-CM

## 2020-02-25 DIAGNOSIS — O99824 Streptococcus B carrier state complicating childbirth: Secondary | ICD-10-CM | POA: Diagnosis present

## 2020-02-25 LAB — CBC
HCT: 31.1 % — ABNORMAL LOW (ref 36.0–46.0)
Hemoglobin: 10.3 g/dL — ABNORMAL LOW (ref 12.0–15.0)
MCH: 25.4 pg — ABNORMAL LOW (ref 26.0–34.0)
MCHC: 33.1 g/dL (ref 30.0–36.0)
MCV: 76.8 fL — ABNORMAL LOW (ref 80.0–100.0)
Platelets: 229 10*3/uL (ref 150–400)
RBC: 4.05 MIL/uL (ref 3.87–5.11)
RDW: 15.3 % (ref 11.5–15.5)
WBC: 9.1 10*3/uL (ref 4.0–10.5)
nRBC: 0 % (ref 0.0–0.2)

## 2020-02-25 LAB — TYPE AND SCREEN
ABO/RH(D): O POS
Antibody Screen: NEGATIVE

## 2020-02-25 LAB — GLUCOSE, CAPILLARY
Glucose-Capillary: 96 mg/dL (ref 70–99)
Glucose-Capillary: 144 mg/dL — ABNORMAL HIGH (ref 70–99)

## 2020-02-25 MED ORDER — LACTATED RINGERS IV SOLN
500.0000 mL | Freq: Once | INTRAVENOUS | Status: AC
  Administered 2020-02-25: 11:00:00 500 mL via INTRAVENOUS

## 2020-02-25 MED ORDER — BUPIVACAINE HCL (PF) 0.25 % IJ SOLN
INTRAMUSCULAR | Status: DC | PRN
  Administered 2020-02-25: 3 mL via EPIDURAL
  Administered 2020-02-25: 2 mL via EPIDURAL

## 2020-02-25 MED ORDER — OXYCODONE-ACETAMINOPHEN 5-325 MG PO TABS: 1.0000 | ORAL_TABLET | ORAL | Status: DC | PRN

## 2020-02-25 MED ORDER — MISOPROSTOL 50MCG HALF TABLET
50.0000 ug | ORAL_TABLET | Freq: Once | ORAL | Status: AC
  Administered 2020-02-25: 07:00:00 50 ug via VAGINAL

## 2020-02-25 MED ORDER — LACTATED RINGERS IV SOLN: 500.0000 mL | INTRAVENOUS | Status: DC | PRN

## 2020-02-25 MED ORDER — BENZOCAINE-MENTHOL 20-0.5 % EX AERO: 1.0000 "application " | INHALATION_SPRAY | CUTANEOUS | Status: DC | PRN

## 2020-02-25 MED ORDER — AMMONIA AROMATIC IN INHA
RESPIRATORY_TRACT | Status: AC
  Filled 2020-02-25: qty 10

## 2020-02-25 MED ORDER — OXYCODONE-ACETAMINOPHEN 5-325 MG PO TABS: 2.0000 | ORAL_TABLET | ORAL | Status: DC | PRN

## 2020-02-25 MED ORDER — OXYTOCIN BOLUS FROM INFUSION
500.0000 mL | Freq: Once | INTRAVENOUS | Status: AC
  Administered 2020-02-25: 13:00:00 500 mL via INTRAVENOUS

## 2020-02-25 MED ORDER — DIPHENHYDRAMINE HCL 50 MG/ML IJ SOLN: 12.5000 mg | INTRAMUSCULAR | Status: DC | PRN

## 2020-02-25 MED ORDER — EPHEDRINE 5 MG/ML INJ
10.0000 mg | INTRAVENOUS | Status: DC | PRN
  Filled 2020-02-25: qty 2

## 2020-02-25 MED ORDER — DOCUSATE SODIUM 100 MG PO CAPS
100.0000 mg | ORAL_CAPSULE | Freq: Two times a day (BID) | ORAL | Status: DC
  Administered 2020-02-25 – 2020-02-26 (×2): 100 mg via ORAL
  Filled 2020-02-25 (×2): qty 1

## 2020-02-25 MED ORDER — OXYTOCIN 40 UNITS IN NORMAL SALINE INFUSION - SIMPLE MED
2.5000 [IU]/h | INTRAVENOUS | Status: DC
  Administered 2020-02-25: 13:00:00 2.5 [IU]/h via INTRAVENOUS

## 2020-02-25 MED ORDER — TETANUS-DIPHTH-ACELL PERTUSSIS 5-2.5-18.5 LF-MCG/0.5 IM SUSP: 0.5000 mL | Freq: Once | INTRAMUSCULAR | Status: DC

## 2020-02-25 MED ORDER — OXYTOCIN 40 UNITS IN NORMAL SALINE INFUSION - SIMPLE MED
1.0000 m[IU]/min | INTRAVENOUS | Status: DC
  Filled 2020-02-25: qty 1000

## 2020-02-25 MED ORDER — MISOPROSTOL 50MCG HALF TABLET
ORAL_TABLET | ORAL | Status: AC
  Filled 2020-02-25: qty 1

## 2020-02-25 MED ORDER — LIDOCAINE-EPINEPHRINE (PF) 1.5 %-1:200000 IJ SOLN
INTRAMUSCULAR | Status: DC | PRN
  Administered 2020-02-25: 3 mL via PERINEURAL
  Administered 2020-02-25: 2 mL via PERINEURAL

## 2020-02-25 MED ORDER — MISOPROSTOL 200 MCG PO TABS
ORAL_TABLET | ORAL | Status: AC
  Filled 2020-02-25: qty 4

## 2020-02-25 MED ORDER — ACETAMINOPHEN 325 MG PO TABS: 650.0000 mg | ORAL_TABLET | ORAL | Status: DC | PRN

## 2020-02-25 MED ORDER — COCONUT OIL OIL
1.0000 "application " | TOPICAL_OIL | Status: DC | PRN
  Administered 2020-02-25: 1 via TOPICAL
  Filled 2020-02-25: qty 120

## 2020-02-25 MED ORDER — ZOLPIDEM TARTRATE 5 MG PO TABS: 5.0000 mg | ORAL_TABLET | Freq: Every evening | ORAL | Status: DC | PRN

## 2020-02-25 MED ORDER — IBUPROFEN 600 MG PO TABS
600.0000 mg | ORAL_TABLET | Freq: Four times a day (QID) | ORAL | Status: DC
  Administered 2020-02-25 – 2020-02-26 (×4): 600 mg via ORAL
  Filled 2020-02-25 (×4): qty 1

## 2020-02-25 MED ORDER — SIMETHICONE 80 MG PO CHEW: 80.0000 mg | CHEWABLE_TABLET | ORAL | Status: DC | PRN

## 2020-02-25 MED ORDER — PHENYLEPHRINE 40 MCG/ML (10ML) SYRINGE FOR IV PUSH (FOR BLOOD PRESSURE SUPPORT)
80.0000 ug | PREFILLED_SYRINGE | INTRAVENOUS | Status: DC | PRN
  Filled 2020-02-25: qty 10

## 2020-02-25 MED ORDER — BUTORPHANOL TARTRATE 1 MG/ML IJ SOLN
1.0000 mg | INTRAMUSCULAR | Status: DC | PRN
  Administered 2020-02-25: 1 mg via INTRAVENOUS
  Filled 2020-02-25: qty 1

## 2020-02-25 MED ORDER — LIDOCAINE HCL (PF) 1 % IJ SOLN
30.0000 mL | INTRAMUSCULAR | Status: DC | PRN
  Filled 2020-02-25: qty 30

## 2020-02-25 MED ORDER — TERBUTALINE SULFATE 1 MG/ML IJ SOLN: 0.2500 mg | Freq: Once | INTRAMUSCULAR | Status: DC | PRN

## 2020-02-25 MED ORDER — LACTATED RINGERS IV SOLN: INTRAVENOUS | Status: DC

## 2020-02-25 MED ORDER — FENTANYL 2.5 MCG/ML W/ROPIVACAINE 0.15% IN NS 100 ML EPIDURAL (ARMC)
12.0000 mL/h | EPIDURAL | Status: DC
  Administered 2020-02-25: 11:00:00 12 mL/h via EPIDURAL

## 2020-02-25 MED ORDER — DIPHENHYDRAMINE HCL 25 MG PO CAPS: 25.0000 mg | ORAL_CAPSULE | Freq: Four times a day (QID) | ORAL | Status: DC | PRN

## 2020-02-25 MED ORDER — OXYTOCIN 40 UNITS IN NORMAL SALINE INFUSION - SIMPLE MED: 2.5000 [IU]/h | INTRAVENOUS | Status: DC | PRN

## 2020-02-25 MED ORDER — LACTATED RINGERS IV BOLUS
1000.0000 mL | Freq: Once | INTRAVENOUS | Status: AC
  Administered 2020-02-25: 1000 mL via INTRAVENOUS

## 2020-02-25 MED ORDER — PRENATAL MULTIVITAMIN CH
1.0000 | ORAL_TABLET | Freq: Every day | ORAL | Status: DC
  Administered 2020-02-26: 15:00:00 1 via ORAL
  Filled 2020-02-25: qty 1

## 2020-02-25 MED ORDER — SOD CITRATE-CITRIC ACID 500-334 MG/5ML PO SOLN: 30.0000 mL | ORAL | Status: DC | PRN

## 2020-02-25 MED ORDER — SODIUM CHLORIDE 0.9 % IV SOLN
1.0000 g | INTRAVENOUS | Status: DC
  Filled 2020-02-25 (×7): qty 1000

## 2020-02-25 MED ORDER — SODIUM CHLORIDE 0.9 % IV SOLN
2.0000 g | Freq: Once | INTRAVENOUS | Status: AC
  Administered 2020-02-25: 2 g via INTRAVENOUS
  Filled 2020-02-25: qty 2000

## 2020-02-25 MED ORDER — LIDOCAINE HCL (PF) 1 % IJ SOLN
INTRAMUSCULAR | Status: DC | PRN
  Administered 2020-02-25: 3 mL

## 2020-02-25 MED ORDER — ONDANSETRON HCL 4 MG/2ML IJ SOLN: 4.0000 mg | Freq: Four times a day (QID) | INTRAMUSCULAR | Status: DC | PRN

## 2020-02-25 MED ORDER — FENTANYL 2.5 MCG/ML W/ROPIVACAINE 0.15% IN NS 100 ML EPIDURAL (ARMC)
EPIDURAL | Status: AC
  Filled 2020-02-25: qty 100

## 2020-02-25 MED ORDER — OXYTOCIN 10 UNIT/ML IJ SOLN
INTRAMUSCULAR | Status: AC
  Filled 2020-02-25: qty 2

## 2020-02-25 MED ORDER — MISOPROSTOL 25 MCG QUARTER TABLET
ORAL_TABLET | ORAL | Status: AC
  Filled 2020-02-25: qty 2

## 2020-02-25 NOTE — Progress Notes (Addendum)
Obvious fetus change of positions from pervious left mid side to right lower side.

## 2020-02-25 NOTE — OB Triage Note (Addendum)
Pt presents with contractions. G3P1, 39.2 weeks. [facility] 03/01/20. Pt stated around 11:00p contractions started 5-7 min apart gradually increased to 3-4 min apart. Pt states she has no N/V/D. No bleeding or gushes of fluid.

## 2020-02-25 NOTE — Anesthesia Procedure Notes (Signed)
Epidural Patient location during procedure: OB Start time: 02/25/2020 11:01 AM End time: 02/25/2020 11:16 AM  Staffing Anesthesiologist: Piscitello, Cleda Mccreedy, MD Resident/CRNA: [name], CRNA Performed: resident/CRNA   Preanesthetic Checklist Completed: patient identified, IV checked, site marked, risks and benefits discussed, surgical consent, monitors and equipment checked, pre-op evaluation and timeout performed  Epidural Patient position: sitting Prep: ChloraPrep Patient monitoring: continuous pulse ox and blood pressure Approach: midline Location: L3-L4 Injection technique: LOR saline  Needle:  Needle type: Tuohy  Needle gauge: 17 G Needle length: 9 cm Needle insertion depth: 7.5 cm Catheter at skin depth: 12 cm Test dose: 1.5% lidocaine with Epi 1:200 K  Assessment Events: blood not aspirated, injection not painful, no injection resistance, no paresthesia and negative IV test  Additional Notes Reason for block:procedure for pain

## 2020-02-25 NOTE — H&P (Signed)
History and Physical   HPI  Mallory Shields is a 24 y.o. G3P1011 at [redacted]w[redacted]d Estimated Date of Delivery: 03/01/20 who is being admitted for irregular contractions with some cervical change and Cat 2 FHR tracing. Known single umbilical artery. Type 2 diabetes   OB History  OB History  Gravida Para Term Preterm AB Living  3 1 1  0 1 1  SAB TAB Ectopic Multiple Live Births  1 0 0 0 1    # Outcome Date GA Lbr Len/2nd Weight Sex Delivery Anes PTL Lv  3 Current           2 SAB 02/2017 [redacted]w[redacted]d         1 Term 09/19/15 [redacted]w[redacted]d 23:21 / 00:07 3360 g F Vag-Spont None  LIV     Name: Mallory Shields     Apgar1: 7  Apgar5: 9    PROBLEM LIST  Pregnancy complications or risks: Patient Active Problem List   Diagnosis Date Noted  . Pre-existing diabetes mellitus in pregnancy 02/25/2020  . COVID-19 affecting pregnancy in third trimester 01/31/2020  . Pre-existing diabetes mellitus affecting pregnancy, antepartum 12/03/2019  . Single umbilical artery affecting management of mother in singleton pregnancy, antepartum 11/12/2019  . Fetal arrhythmia affecting pregnancy, antepartum 11/12/2019  . History of recurrent UTI (urinary tract infection) 10/08/2019  . Obesity in pregnancy 08/06/2019  . UTI in pregnancy, antepartum, first trimester 08/06/2019  . Elevated hemoglobin A1c 08/06/2019  . Pregnant 07/10/2019  . 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 Overweight 02/10/2015    Prenatal labs and studies: ABO, Rh: --/--/PENDING (04/06 7782) Antibody: PENDING (04/06 0748) Rubella: 2.51 (08/28 1417) RPR: Non Reactive (01/12 1643)  HBsAg: Negative (08/28 1417)  HIV: Non Reactive (08/28 1417)  UMP:NTIRWERX/-- (03/12 1612)   Past Medical History:  Diagnosis Date  . Gestational diabetes   . History of COVID-10 January 2020  . History of urinary tract infection   . Miscarriage, threatened, early pregnancy 12/2016     Past Surgical History:  Procedure Laterality  Date  . CHOLECYSTECTOMY N/A 11/05/2015   Procedure: LAPAROSCOPIC CHOLECYSTECTOMY WITH INTRAOPERATIVE CHOLANGIOGRAM;  Surgeon: Florene Glen, MD;  Location: [facility] ORS;  Service: General;  Laterality: N/A;  . ERCP N/A 11/02/2015   Procedure: ENDOSCOPIC RETROGRADE CHOLANGIOPANCREATOGRAPHY (ERCP);  Surgeon: Hulen Luster, MD;  Location[facility] END[facility]COPY;  Service: Endoscopy;  Laterality: N/A;     Medications    Current Discharge Medication List    CONTINUE these medications which have NOT CHANGED   Details  Accu-Chek FastClix Lancets MISC Check BS 4 times daily Qty: 100 each, Refills: 12   Associated Diagnoses: Gestational diabetes mellitus (GDM) in second trimester, gestational diabetes method of control unspecified    glucose blood (ACCU-CHEK GUIDE) test strip Use as instructed Qty: 100 each, Refills: 12   Associated Diagnoses: Gestational diabetes mellitus (GDM) in second trimester, gestational diabetes method of control unspecified    nitrofurantoin, macrocrystal-monohydrate, (MACROBID) 100 MG capsule Take 1 tablet by mouth twice a day for 7 days, then decrease to one tablet daily. Qty: 60 capsule, Refills: 1   Associated Diagnoses: History of recurrent UTIs    Prenatal Vit-Fe Fumarate-FA (PRENATAL VITAMIN) 27-0.8 MG TABS Take 1 tablet by mouth daily at 6 (six) AM. Qty: 100 tablet, Refills: 0   Associated Diagnoses: Positive pregnancy test         Allergies  Patient has no known allergies.  Review of Systems  Pertinent items are noted  in HPI.  Physical Exam  BP 115/64 (BP Location: Right Arm)   Pulse 86   Temp 98.1 F (36.7 C) (Oral)   Resp 16   Ht 5\' 4"  (1.626 m)   Wt 92.1 kg   LMP 05/12/2019 (Exact Date)   SpO2 99%   BMI 34.84 kg/m   Lungs:  CTA B Cardio: RRR without M/R/G Abd: Soft, gravid, NT Presentation: cephalic EXT: No C/C/ 1+ Edema DTRs: 2+ B CERVIX: Dilation: 3 Effacement (%): 30 Station: -3 Presentation: Vertex Exam by:: Tashanna Dolin MD  See  Prenatal records for more detailed PE.     FHR:  Variability: Good {> 6 bpm)  Patient went through the night without any 15 x 15 accelerations.  Occasional decelerations lasting for 1 to 2 minutes and down to 80 to 90 bpm occurred through the night.  She recently had a few 15 x 15 accelerations this morning.  Toco: Uterine Contractions: Mild irregular  Test Results  Results for orders placed or performed during the hospital encounter of 02/25/20 (from the past 24 hour(s))  CBC     Status: Abnormal   Collection Time: 02/25/20  7:48 AM  Result Value Ref Range   WBC 9.1 4.0 - 10.5 K/uL   RBC 4.05 3.87 - 5.11 MIL/uL   Hemoglobin 10.3 (L) 12.0 - 15.0 g/dL   HCT 04/26/20 (L) 25.4 - 27.0 %   MCV 76.8 (L) 80.0 - 100.0 fL   MCH 25.4 (L) 26.0 - 34.0 pg   MCHC 33.1 30.0 - 36.0 g/dL   RDW 62.3 76.2 - 83.1 %   Platelets 229 150 - 400 K/uL   nRBC 0.0 0.0 - 0.2 %  Type and screen     Status: None (Preliminary result)   Collection Time: 02/25/20  7:48 AM  Result Value Ref Range   ABO/RH(D) PENDING    Antibody Screen PENDING    Sample Expiration      02/28/2020,2359 Performed at [facility], [address] [place], [place]   Glucose, capillary     Status: None   Collection Time: 02/25/20  8:01 AM  Result Value Ref Range   Glucose-Capillary 96 70 - 99 mg/dL   50 mcg misoprostol placed.  Assessment   G3P1011 at [redacted]w[redacted]d Estimated Date of Delivery: 03/01/20  The fetus has a single umbilical artery and had some decelerations through the night but now has had some 15 x 15 accelerations. Patient has undergone cervical change from 2 to 3 cm.  Was scheduled for induction tomorrow because of her diabetes  Patient Active Problem List   Diagnosis Date Noted  . Pre-existing diabetes mellitus in pregnancy 02/25/2020  . COVID-19 affecting pregnancy in third trimester 01/31/2020  . Pre-existing diabetes mellitus affecting pregnancy, antepartum 12/03/2019  . Single umbilical artery  affecting management of mother in singleton pregnancy, antepartum 11/12/2019  . Fetal arrhythmia affecting pregnancy, antepartum 11/12/2019  . History of recurrent UTI (urinary tract infection) 10/08/2019  . Obesity in pregnancy 08/06/2019  . UTI in pregnancy, antepartum, first trimester 08/06/2019  . Elevated hemoglobin A1c 08/06/2019  . Pregnant 07/10/2019  . 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 Overweight 02/10/2015    Plan  1. Admit to L&D :   2. EFM: -- Category 1 3. Stadol or Epidural if desired.   4. Admission labs  5. Check FS glucose  02/12/2015, M.D. 02/25/2020 8:20 AM

## 2020-02-25 NOTE — Lactation Note (Signed)
This note was copied from a baby's chart. Lactation Consultation Note  Patient Name: Mallory Shields Today's Date: 02/25/2020 Reason for consult: Initial assessment;Term;Other (Comment)(Mom GDM)  LC called to LDR4 for assistance with P2 mom who was GDM (diet controlled). Mom BF first baby for 2 months- discontinued due to need for surgery- no problems reported with BF. Baby was skin to skin, with early hunger cues. Mom opted for modified cradle/cross-cradle on left breast. LC assisted with support pillows, bringing baby up to breast level, and turning him tummy to tummy. Baby grasped breast easily, began strong rhythmic sucking; mom reported tugging feeling. LC discussed/reviewed hand expression, options for alternate feeding methods based on BS levels of baby after feed. Parents understanding. LC to follow-up.  Maternal Data Formula Feeding for Exclusion: No Has patient been taught Hand Expression?: Yes Does the patient have breastfeeding experience prior to this delivery?: Yes  Feeding Feeding Type: Breast Fed  LATCH Score Latch: Grasps breast easily, tongue down, lips flanged, rhythmical sucking.  Audible Swallowing: A few with stimulation  Type of Nipple: Everted at rest and after stimulation  Comfort (Breast/Nipple): Soft / non-tender  Hold (Positioning): Assistance needed to correctly position infant at breast and maintain latch.  LATCH Score: 8  Interventions Interventions: Breast feeding basics reviewed;Support pillows;Adjust position;Hand express;Position options  Lactation Tools Discussed/Used     Consult Status Consult Status: Follow-up Date: 02/25/20 Follow-up type: In-patient    Danford Bad 02/25/2020, 1:17 PM

## 2020-02-25 NOTE — Progress Notes (Signed)
Obvious fetal position change from the lower right abdomen back to the lower middle abdomen with FHR deceleration in the 90's

## 2020-02-25 NOTE — Progress Notes (Signed)
Pt returned from the bathroom FHR dropped into the 80-90's

## 2020-02-25 NOTE — Anesthesia Preprocedure Evaluation (Signed)
Anesthesia Evaluation  Patient identified by MRN, date of birth, ID band Patient awake    Reviewed: Allergy & Precautions, H&P , NPO status , Patient's Chart, lab work & pertinent test results  Airway Mallampati: II  TM Distance: >3 FB Neck ROM: full    Dental no notable dental hx.    Pulmonary neg pulmonary ROS,           Cardiovascular negative cardio ROS       Neuro/Psych negative neurological ROS  negative psych ROS   GI/Hepatic Neg liver ROS, GERD  ,  Endo/Other  diabetes  Renal/GU negative Renal ROS  negative genitourinary   Musculoskeletal   Abdominal   Peds  Hematology negative hematology ROS (+)   Anesthesia Other Findings   Reproductive/Obstetrics (+) Pregnancy                             Anesthesia Physical Anesthesia Plan  ASA: II  Anesthesia Plan: Epidural   Post-op Pain Management:    Induction:   PONV Risk Score and Plan:   Airway Management Planned:   Additional Equipment:   Intra-op Plan:   Post-operative Plan:   Informed Consent: I have reviewed the patients History and Physical, chart, labs and discussed the procedure including the risks, benefits and alternatives for the proposed anesthesia with the patient or authorized representative who has indicated his/her understanding and acceptance.       Plan Discussed with: Anesthesiologist and CRNA  Anesthesia Plan Comments:         Anesthesia Quick Evaluation

## 2020-02-26 LAB — RPR: RPR Ser Ql: NONREACTIVE

## 2020-02-26 NOTE — Lactation Note (Signed)
This note was copied from a baby's chart. Lactation Consultation Note  Patient Name: Mallory Shields JMEQA'S Date: 02/26/2020 Reason for consult: Follow-up assessment  Nurse Skyler reached out to [facility] to explain how baby had a bilirubin level of 9.4 and wanted to know how feedings had gone. [facility] student explained baby needed to be stimulated at the breast latched well when active.   [facility] student followed up with the dyad after a second bilirubin check to initiate a feeding. MOB latched baby to the breast with ease and LC student explained what MOB should be looking for with flanged lips and how to keep baby active. Baby fed for about 15 minutes on the left breast in the cradle position and popped off on own when finished. [facility] student pointed out swallows so the parents knew what to look and listen for when they go home. As mom was nursing baby [facility] student explained the set up, cleaning and storage of milk for the parents. [facility] student also hand expressed a few drops that she was able to feed to baby after being at the breast.  [facility] student suggested that if baby waits more than 3 hours for a feeding then MOB should pump to keep breast stimulated and feed baby any expressed milk. MOB mentioned having a pump at home that she could use. [employer] student mentioned that parents should keep watching for pees and poops. After the feed Nurse Skyler confirmed that babies new bilirubin level is at a 7 and asked if they were okay going home with the bilirubin level being right at the cut off for "normal". Parents were okay with it and LC student reiterated that baby needs to feed so we do not have any spikes in the bilirubin later. [employer] student also let the family know that they can reach out to [facility] office if they get home and have questions.  Both parents felt good about the information and suggestions given and felt confident in going home.  Maternal Data Has patient been taught Hand Expression?: Yes Does the patient have  breastfeeding experience prior to this delivery?: Yes  Feeding Feeding Type: Breast Fed  LATCH Score Latch: Grasps breast easily, tongue down, lips flanged, rhythmical sucking.  Audible Swallowing: A few with stimulation  Type of Nipple: Everted at rest and after stimulation  Comfort (Breast/Nipple): Filling, red/small blisters or bruises, mild/mod discomfort  Hold (Positioning): No assistance needed to correctly position infant at breast.  LATCH Score: 8  Interventions Interventions: Breast compression;Support pillows;DEBP  Lactation Tools Discussed/Used Pump Review: Setup, frequency, and cleaning;Milk Storage Initiated by:: Rachelle Hora LC Student Date initiated:: 02/26/20   Consult Status Consult Status: Complete Date: 02/26/20    Thersa Salt Dail Lerew 02/26/2020, 3:10 PM

## 2020-02-26 NOTE — Lactation Note (Signed)
This note was copied from a baby's chart. Lactation Consultation Note  Patient Name: Mallory Shields GUYQI'H Date: 02/26/2020 Reason for consult: Initial assessment  Upon entering the room, FOB was consoling baby and MOB was exiting the restroom. Parents say that things have been going well so far and that baby is feeding well. [facility] student went over some breastfeeding basics with the parents by explained cue based feeding, 8-12 feedings in 24 hours, cluster feeding, demonstrated hand expression and explained resources for when the family gets home. MOB mentioned having coconut oil and comfort gels so LC student asked to look at her breast and noticed some redness but no scarring.   Baby started cuing and FOB, who was very encouraging, handed him off to MOB who placed baby in the cradle position on the left breast for about 10 minutes. Baby had a good latch and had to be adjusted with some support pillows. Baby also took breaks so [facility] student explained ways to keep baby alert at breast to prevent nipple damage.MOB was doing a great job sandwiching the breast upon latching the baby and continuing breast compressions to work on keeping him active at breast. [facility] student explained that baby may have a cluster feeding session today and may have already started and how this is his way of telling mom's body what he needs. [facility] student encouraged them and reassured that they were doing a great job.  Parents seemed comfortable with the information shared. Agreed they would reach out if they had any questions or concerns before they left. Maternal Data Has patient been taught Hand Expression?: Yes  Feeding Feeding Type: Breast Fed  LATCH Score Latch: Grasps breast easily, tongue down, lips flanged, rhythmical sucking.  Audible Swallowing: A few with stimulation  Type of Nipple: Everted at rest and after stimulation  Comfort (Breast/Nipple): Filling, red/small blisters or bruises, mild/mod  discomfort  Hold (Positioning): Assistance needed to correctly position infant at breast and maintain latch.  LATCH Score: 7  Interventions Interventions: Breast feeding basics reviewed;Breast compression;Comfort gels;Adjust position;Skin to skin;Support pillows;Hand express;Coconut oil  Lactation Tools Discussed/Used Tools: Coconut oil;Comfort gels   Consult Status Consult Status: Complete Date: 02/26/20    Thersa Salt [facility] 02/26/2020, 10:35 AM

## 2020-02-26 NOTE — Discharge Instructions (Signed)

## 2020-02-26 NOTE — Anesthesia Postprocedure Evaluation (Signed)
Anesthesia Post Note  Patient: Mallory Shields  Procedure(s) Performed: AN AD HOC LABOR EPIDURAL  Patient location during evaluation: Mother Baby Anesthesia Type: Epidural Level of consciousness: awake and alert Pain management: pain level controlled Vital Signs Assessment: post-procedure vital signs reviewed and stable Respiratory status: spontaneous breathing, nonlabored ventilation and respiratory function stable Cardiovascular status: stable Postop Assessment: no headache, no backache and epidural receding Anesthetic complications: no     Last Vitals:  Vitals:   02/25/20 2310 02/26/20 0727  BP: 107/65 102/66  Pulse: 81 70  Resp: 18 16  Temp: 36.4 C 36.7 C  SpO2: 100% 100%    Last Pain:  Vitals:   02/26/20 0727  TempSrc: Oral  PainSc:                  [name]

## 2020-02-26 NOTE — Discharge Summary (Signed)
      Patient Name: Mallory Shields DOB: 02-21-1996 MRN: [id number]                            Discharge Summary  Date of Admission: 02/25/2020 Date of Discharge: 02/26/2020 Delivering Provider: Linzie Collin   Admitting Diagnosis: Pre-existing diabetes mellitus in pregnancy [O24.319] at [redacted]w[redacted]d.  Early labor, Cat 2 FHR Secondary diagnosis:  Active Problems:   Pre-existing diabetes mellitus in pregnancy  Mode of Delivery: normal spontaneous vaginal delivery    Misoprostol induction          Discharge diagnosis: Term Pregnancy Delivered and Type 2 DM      Intrapartum Procedures: Atificial rupture of membranes, epidural, GBS prophylaxis and Misoprostol   Post partum procedures:   Complications:                     SEE EARLIER NOTE FOR PROGRESS  Edinburgh Score: Edinburgh Postnatal Depression Scale Screening Tool 02/25/2020  I have been able to laugh and see the funny side of things. 0  I have looked forward with enjoyment to things. 0  I have blamed myself unnecessarily when things went wrong. 0  I have been anxious or worried for no good reason. 0  I have felt scared or panicky for no good reason. 0  Things have been getting on top of me. 0  I have been so unhappy that I have had difficulty sleeping. 0  I have felt sad or miserable. 0  I have been so unhappy that I have been crying. 0  The thought of harming myself has occurred to me. 0  Edinburgh Postnatal Depression Scale Total 0    Assessment/Plan  Active Problems:   Pre-existing diabetes mellitus in pregnancy   Plan for discharge today.  Pediatrics has allowed discharge today in light of positive GBS status and 2 doses of antibiotics given during labor.  Discharge Instructions: Per After Visit Summary. Activity: Advance as tolerated. Pelvic rest for 6 weeks.  Also refer to After Visit Summary Diet: Regular Medications: Allergies as of 02/26/2020   No Known Allergies     Medication List    STOP taking these  medications   nitrofurantoin (macrocrystal-monohydrate) 100 MG capsule Commonly known as: MACROBID     TAKE these medications   Accu-Chek FastClix Lancets Misc Check BS 4 times daily   Accu-Chek Guide test strip Generic drug: glucose blood Use as instructed   Prenatal Vitamin 27-0.8 MG Tabs Take 1 tablet by mouth daily at 6 (six) AM.      Outpatient follow up:  Follow-up Information    Linzie Collin, MD Follow up in 6 week(s).   Specialties: Obstetrics and Gynecology, Radiology Contact information: [address] [place] [address] [phone]          Postpartum contraception: Will discuss at first office visit post-partum  Discharged Condition: good  Discharged to: home  Newborn Data: Disposition:home with mother  Apgars: APGAR (1 MIN): 7   APGAR (5 MINS): 9   APGAR (10 MINS):    Baby Feeding: Breast    Elonda Husky, M.D. 02/26/2020 9:00 AM

## 2020-02-26 NOTE — Progress Notes (Signed)
Patient ID: Mallory Shields, female   DOB: July 02, 1996, 24 y.o.   MRN: [id number]  Progress Note - Vaginal Delivery  Mallory Shields Tedd Sias is a 24 y.o. [phone] now PP day 1 s/p Vaginal, Spontaneous .   Subjective:  The patient reports no complaints, up ad lib, voiding and tolerating PO She is breast-feeding  Objective:  Vital signs in last 24 hours: Temp:  [97.6 F (36.4 C)-98.5 F (36.9 C)] 98 F (36.7 C) (04/07 0727) Pulse Rate:  [60-124] 70 (04/07 0727) Resp:  [16-18] 16 (04/07 0727) BP: (94-142)/(51-97) 102/66 (04/07 0727) SpO2:  [97 %-100 %] 100 % (04/07 0727)  Physical Exam:  General: alert and cooperative Lochia: appropriate Uterine Fundus: firm    Data Review Recent Labs    02/25/20 0748  HGB 10.3*  HCT 31.1*    Assessment/Plan: Active Problems:   Pre-existing diabetes mellitus in pregnancy   Plan for discharge tomorrow. pediatrics is requiring baby to stay 48 hours as mom is a second dose of antibiotics for GBS was infusing during second stage.?!   -- Continue routine PP care.     Elonda Husky, M.D. 02/26/2020 8:14 AM

## 2020-02-26 NOTE — Progress Notes (Signed)
Patient is breast feeding at this time and reports no pain. Dr. Logan Bores at bedside updating patient.

## 2020-02-26 NOTE — Progress Notes (Cosign Needed)
Prenatal vitamin given. Lactation consultant at bedside teaching mother breastfeeding techniques. No needs expressed at this time.

## 2020-04-22 ENCOUNTER — Ambulatory Visit (INDEPENDENT_AMBULATORY_CARE_PROVIDER_SITE_OTHER): Payer: Medicaid Other | Admitting: Obstetrics and Gynecology

## 2020-04-22 ENCOUNTER — Other Ambulatory Visit: Payer: Self-pay

## 2020-04-22 ENCOUNTER — Encounter: Payer: Self-pay | Admitting: Obstetrics and Gynecology

## 2020-04-22 DIAGNOSIS — O24439 Gestational diabetes mellitus in the puerperium, unspecified control: Secondary | ICD-10-CM

## 2020-04-22 NOTE — Progress Notes (Signed)
HPI:      [name] is a 24 y.o. [id number] who LMP was Patient's last menstrual period was 03/23/2020 (approximate).  Subjective:   She presents today approximately 8 weeks postpartum.  She reports no problems.  She does think she has had a menstrual period.  She has resumed sexual activity using condoms for birth control.  She plans to continue to use condoms for birth control.  Does not desire other forms of birth control at this time.  She states that she has checked her sugars a few times at home but not regularly.  She reports them as "normal".  She desires formal glucose tolerance testing to make sure she is not a diabetic.  She is breast-feeding full-time and reports that her baby is doing well.    Hx: The following portions of the patient's history were reviewed and updated as appropriate:             She  has a past medical history of Gestational diabetes, History of COVID-19, History of urinary tract infection, and Miscarriage, threatened, early pregnancy (12/2016). She does not have any pertinent problems on file. She  has a past surgical history that includes ERCP (N/A, 11/02/2015) and Cholecystectomy (N/A, 11/05/2015). Her family history includes Diabetes in her mother; Gallstones in her father, mother, and sister; Glaucoma in her father. She  reports that she has never smoked. She has never used smokeless tobacco. She reports previous alcohol use. She reports that she does not use drugs. She currently has no medications in their medication list. She has No Known Allergies.       Review of Systems:  Review of Systems  Constitutional: Denied constitutional symptoms, night sweats, recent illness, fatigue, fever, insomnia and weight loss.  Eyes: Denied eye symptoms, eye pain, photophobia, vision change and visual disturbance.  Ears/Nose/Throat/Neck: Denied ear, nose, throat or neck symptoms, hearing loss, nasal discharge, sinus congestion and sore throat.  Cardiovascular:  Denied cardiovascular symptoms, arrhythmia, chest pain/pressure, edema, exercise intolerance, orthopnea and palpitations.  Respiratory: Denied pulmonary symptoms, asthma, pleuritic pain, productive sputum, cough, dyspnea and wheezing.  Gastrointestinal: Denied, gastro-esophageal reflux, melena, nausea and vomiting.  Genitourinary: Denied genitourinary symptoms including symptomatic vaginal discharge, pelvic relaxation issues, and urinary complaints.  Musculoskeletal: Denied musculoskeletal symptoms, stiffness, swelling, muscle weakness and myalgia.  Dermatologic: Denied dermatology symptoms, rash and scar.  Neurologic: Denied neurology symptoms, dizziness, headache, neck pain and syncope.  Psychiatric: Denied psychiatric symptoms, anxiety and depression.  Endocrine: Denied endocrine symptoms including hot flashes and night sweats.   Meds:   No current outpatient medications on file prior to visit.   No current facility-administered medications on file prior to visit.    Objective:     Vitals:   04/22/20 1506  BP: 103/71  Pulse: 66              Pelvic examination   Pelvic:   Vulva: Normal appearance.  No lesions.  No abnormal scarring.    Vagina: No lesions or abnormalities noted.  Support: Normal pelvic support.  Urethra No masses tenderness or scarring.  Meatus Normal size without lesions or prolapse.  Cervix: Normal ectropion.  No lesions.  Anus: Normal exam.  No lesions.  Perineum: Normal exam.  No lesions.  Healed well.          Bimanual   Uterus: Normal size.  Non-tender.  Mobile.  AV.  Adnexae: No masses.  Non-tender to palpation.  Cul-de-sac: Negative for abnormality.  Assessment:    [id number] Patient Active Problem List   Diagnosis Date Noted  . Pre-existing diabetes mellitus in pregnancy 02/25/2020  . COVID-19 affecting pregnancy in third trimester 01/31/2020  . Pre-existing diabetes mellitus affecting pregnancy, antepartum 12/03/2019  . Single umbilical  artery affecting management of mother in singleton pregnancy, antepartum 11/12/2019  . Fetal arrhythmia affecting pregnancy, antepartum 11/12/2019  . History of recurrent UTI (urinary tract infection) 10/08/2019  . Obesity in pregnancy 08/06/2019  . UTI in pregnancy, antepartum, first trimester 08/06/2019  . Elevated hemoglobin A1c 08/06/2019  . Pregnant 07/10/2019  . 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 Overweight 02/10/2015     1. Postpartum care and examination immediately after delivery   2. Gestational diabetes mellitus (GDM), postpartum     Patient doing well postpartum.   Plan:            1.  Patient may resume normal activities with the exception of heavy lifting.  2.  Condoms for birth control  3.  75 g postpartum GTT Orders Orders Placed This Encounter  Procedures  . Glucose tolerance, 2 hours    No orders of the defined types were placed in this encounter.     F/U  Return in about 3 months (around 07/23/2020).  Elonda Husky, M.D. 04/22/2020 3:35 PM

## 2020-04-28 ENCOUNTER — Other Ambulatory Visit: Payer: Medicaid Other

## 2020-05-12 DIAGNOSIS — S80219A Abrasion, unspecified knee, initial encounter: Secondary | ICD-10-CM | POA: Diagnosis not present

## 2020-05-12 DIAGNOSIS — L03115 Cellulitis of right lower limb: Secondary | ICD-10-CM | POA: Diagnosis not present

## 2020-05-12 DIAGNOSIS — L03116 Cellulitis of left lower limb: Secondary | ICD-10-CM | POA: Diagnosis not present

## 2020-07-28 ENCOUNTER — Encounter: Payer: Medicaid Other | Admitting: Obstetrics and Gynecology

## 2022-02-12 ENCOUNTER — Emergency Department: Payer: Medicaid Other

## 2022-02-12 ENCOUNTER — Emergency Department
Admission: EM | Admit: 2022-02-12 | Discharge: 2022-02-12 | Disposition: A | Discharge status: Home / Self Care | Payer: Medicaid Other | Attending: Emergency Medicine | Admitting: Emergency Medicine

## 2022-02-12 DIAGNOSIS — K529 Noninfective gastroenteritis and colitis, unspecified: Secondary | ICD-10-CM | POA: Diagnosis not present

## 2022-02-12 DIAGNOSIS — E860 Dehydration: Secondary | ICD-10-CM

## 2022-02-12 DIAGNOSIS — K760 Fatty (change of) liver, not elsewhere classified: Secondary | ICD-10-CM | POA: Diagnosis not present

## 2022-02-12 DIAGNOSIS — R000 Tachycardia, unspecified: Secondary | ICD-10-CM | POA: Insufficient documentation

## 2022-02-12 DIAGNOSIS — Z20822 Contact with and (suspected) exposure to covid-19: Secondary | ICD-10-CM | POA: Insufficient documentation

## 2022-02-12 DIAGNOSIS — N3000 Acute cystitis without hematuria: Secondary | ICD-10-CM | POA: Insufficient documentation

## 2022-02-12 DIAGNOSIS — D72829 Elevated white blood cell count, unspecified: Secondary | ICD-10-CM | POA: Diagnosis not present

## 2022-02-12 DIAGNOSIS — R1013 Epigastric pain: Secondary | ICD-10-CM | POA: Diagnosis present

## 2022-02-12 DIAGNOSIS — R109 Unspecified abdominal pain: Secondary | ICD-10-CM | POA: Diagnosis not present

## 2022-02-12 LAB — URINALYSIS, ROUTINE W REFLEX MICROSCOPIC
Bilirubin Urine: NEGATIVE
Glucose, UA: NEGATIVE mg/dL
Hgb urine dipstick: NEGATIVE
Ketones, ur: NEGATIVE mg/dL
Leukocytes,Ua: NEGATIVE
Nitrite: NEGATIVE
Protein, ur: 100 mg/dL — AB
Specific Gravity, Urine: 1.032 — ABNORMAL HIGH (ref 1.005–1.030)
Squamous Epithelial / HPF: NONE SEEN (ref 0–5)
pH: 5 (ref 5.0–8.0)

## 2022-02-12 LAB — RESP PANEL BY RT-PCR (FLU A&B, COVID) ARPGX2
Influenza A by PCR: NEGATIVE
Influenza B by PCR: NEGATIVE
SARS Coronavirus 2 by RT PCR: NEGATIVE

## 2022-02-12 LAB — BILIRUBIN, FRACTIONATED(TOT/DIR/INDIR)
Bilirubin, Direct: 0.3 mg/dL — ABNORMAL HIGH (ref 0.0–0.2)
Indirect Bilirubin: 2.2 mg/dL — ABNORMAL HIGH (ref 0.3–0.9)
Total Bilirubin: 2.5 mg/dL — ABNORMAL HIGH (ref 0.3–1.2)

## 2022-02-12 LAB — COMPREHENSIVE METABOLIC PANEL
ALT: 28 U/L (ref 0–44)
AST: 33 U/L (ref 15–41)
Albumin: 4.7 g/dL (ref 3.5–5.0)
Alkaline Phosphatase: 68 U/L (ref 38–126)
Anion gap: 11 (ref 5–15)
BUN: 14 mg/dL (ref 6–20)
CO2: 20 mmol/L — ABNORMAL LOW (ref 22–32)
Calcium: 9.3 mg/dL (ref 8.9–10.3)
Chloride: 107 mmol/L (ref 98–111)
Creatinine, Ser: 0.66 mg/dL (ref 0.44–1.00)
GFR, Estimated: >60 mL/min (ref >60)
Glucose, Bld: 199 mg/dL — ABNORMAL HIGH (ref 70–99)
Potassium: 3.8 mmol/L (ref 3.5–5.1)
Sodium: 138 mmol/L (ref 135–145)
Total Bilirubin: 2.5 mg/dL — ABNORMAL HIGH (ref 0.3–1.2)
Total Protein: 8.3 g/dL — ABNORMAL HIGH (ref 6.5–8.1)

## 2022-02-12 LAB — CBC
HCT: 45.2 % (ref 36.0–46.0)
Hemoglobin: 14.5 g/dL (ref 12.0–15.0)
MCH: 26.4 pg (ref 26.0–34.0)
MCHC: 32.1 g/dL (ref 30.0–36.0)
MCV: 82.2 fL (ref 80.0–100.0)
Platelets: 347 10*3/uL (ref 150–400)
RBC: 5.5 MIL/uL — ABNORMAL HIGH (ref 3.87–5.11)
RDW: 13.8 % (ref 11.5–15.5)
WBC: 17.9 10*3/uL — ABNORMAL HIGH (ref 4.0–10.5)
nRBC: 0 % (ref 0.0–0.2)

## 2022-02-12 LAB — POC URINE PREG, ED: Preg Test, Ur: NEGATIVE

## 2022-02-12 LAB — LIPASE, BLOOD: Lipase: 46 U/L (ref 11–51)

## 2022-02-12 MED ORDER — SODIUM CHLORIDE 0.9 % IV BOLUS
1000.0000 mL | Freq: Once | INTRAVENOUS | Status: AC
  Administered 2022-02-12: 1000 mL via INTRAVENOUS

## 2022-02-12 MED ORDER — PANTOPRAZOLE SODIUM 40 MG IV SOLR
40.0000 mg | Freq: Once | INTRAVENOUS | Status: AC
  Administered 2022-02-12: 40 mg via INTRAVENOUS
  Filled 2022-02-12: qty 10

## 2022-02-12 MED ORDER — ONDANSETRON 4 MG PO TBDP: 4.0000 mg | ORAL_TABLET | Freq: Three times a day (TID) | ORAL | 0 refills | Status: AC | PRN

## 2022-02-12 MED ORDER — ONDANSETRON HCL 4 MG/2ML IJ SOLN
4.0000 mg | Freq: Once | INTRAMUSCULAR | Status: AC
  Administered 2022-02-12: 4 mg via INTRAVENOUS
  Filled 2022-02-12: qty 2

## 2022-02-12 MED ORDER — MORPHINE SULFATE (PF) 4 MG/ML IV SOLN
4.0000 mg | Freq: Once | INTRAVENOUS | Status: AC
  Administered 2022-02-12: 4 mg via INTRAVENOUS
  Filled 2022-02-12: qty 1

## 2022-02-12 MED ORDER — HALOPERIDOL LACTATE 5 MG/ML IJ SOLN
1.0000 mg | Freq: Once | INTRAMUSCULAR | Status: AC
  Administered 2022-02-12: 1 mg via INTRAVENOUS
  Filled 2022-02-12: qty 1

## 2022-02-12 MED ORDER — CEPHALEXIN 500 MG PO CAPS: 500.0000 mg | ORAL_CAPSULE | Freq: Two times a day (BID) | ORAL | 0 refills | Status: AC

## 2022-02-12 MED ORDER — HALOPERIDOL LACTATE 5 MG/ML IJ SOLN: 2.0000 mg | Freq: Once | INTRAMUSCULAR | Status: DC

## 2022-02-12 MED ORDER — IOHEXOL 300 MG/ML  SOLN
80.0000 mL | Freq: Once | INTRAMUSCULAR | Status: AC | PRN
  Administered 2022-02-12: 80 mL via INTRAVENOUS

## 2022-02-12 NOTE — ED Provider Notes (Addendum)
? ?[facility] ?Provider Note ? ? ? Event Date/Time  ? First MD Initiated Contact with Patient 02/12/22 [phone]   ?  (approximate) ? ? ?History  ? ?Abdominal Pain ? ? ?HPI ? ?Mallory Shields is a 26 y.o. female  who has history of pancreatitis who comes in with vomiting.  Patient reports having 10 episodes of vomiting and abdominal discomfort that started around 10 PM yesterday.  She reports it is epigastric pain in nature.  She states it feels similar to when she was admitted for pancreatitis and had to have her gallbladder removed.  She states that she last had a vaginal delivery in October.  Since then she came back on her period and had the Depo shot in February.  She denies any significant chest pain or shortness of breath other than just catching her breath due to the pain.  She denies any lower abdominal pain, vaginal discharge.  She denies any other surgeries.  She had vaginal deliveries only.  Denies any urinary symptoms.  She denies any be else being sick at home.  Denies anything different.  Denies any diarrhea. Shed denies breast feeding. ? ?Reviewed 10/2015 admit- had MRCP without evidence of CBD stone and ERCP no stone and Cholecystecomy on 12/16.  ? ? ? ?Physical Exam  ? ?Triage Vital Signs: ?ED Triage Vitals [02/12/22 0635]  ?Enc Vitals Group  ?   BP 119/80  ?   Pulse Rate 90  ?   Resp (!) 26  ?   Temp 97.8 ?F (36.6 ?C)  ?   Temp Source Oral  ?   SpO2 97 %  ?   Weight   ?   Height   ?   Head Circumference   ?   Peak Flow   ?   Pain Score 9  ?   Pain Loc   ?   Pain Edu?   ?   Excl. in GC?   ? ? ?Most recent vital signs: ?Vitals:  ? 02/12/22 0635  ?BP: 119/80  ?Pulse: 90  ?Resp: (!) 26  ?Temp: 97.8 ?F (36.6 ?C)  ?SpO2: 97%  ? ? ? ?General: Awake, no distress.  ?CV:  Good peripheral perfusion.  Tachycardic ?Resp:  Normal effort.  ?Abd:  No distention.  Patient has some epigastric tenderness without any rebound or guarding. ? ? ? ?ED Results / Procedures / Treatments  ? ?Labs ?(all  labs ordered are listed, but only abnormal results are displayed) ?Labs Reviewed  ?LIPASE, BLOOD  ?COMPREHENSIVE METABOLIC PANEL  ?CBC  ?URINALYSIS, ROUTINE W REFLEX MICROSCOPIC  ?POC URINE PREG, ED  ? ? ? ?EKG ? ?My interpretation of EKG: ? ?Normal sinus rate of 100 without any ST elevation or T wave inversions, normal intervals ? ? ?RADIOLOGY ?I have reviewed the CT personally-patient has a lot of diarrheal illness noted in the intestines ? ? ?PROCEDURES: ? ?Critical Care performed: No ? ?.1-3 Lead EKG Interpretation ?Performed by: Concha SeFunke, Areatha Kalata E, MD ?Authorized by: Concha SeFunke, Habeeb Puertas E, MD  ? ?  Interpretation: normal   ?  ECG rate:  90 ?  ECG rate assessment: normal   ?  Rhythm: sinus rhythm   ?  Ectopy: none   ?  Conduction: normal   ? ? ?MEDICATIONS ORDERED IN ED: ?Medications  ?sodium chloride 0.9 % bolus 1,000 mL (1,000 mLs Intravenous New Bag/Given 02/12/22 0729)  ?ondansetron [facility](ZOFRAN) injection 4 mg (4 mg Intravenous Given 02/12/22 0729)  ?morphine (PF) 4 MG/ML  injection 4 mg (4 mg Intravenous Given 02/12/22 0729)  ?pantoprazole (PROTONIX) injection 40 mg (40 mg Intravenous Given 02/12/22 0729)  ? ? ? ?IMPRESSION / MDM / ASSESSMENT AND PLAN / ED COURSE  ?I reviewed the triage vital signs and the nursing notes. ?             ?               ? ?Differential diagnosis includes, but is not limited to, pancreatitis, retained gallstone, gastritis, ulcer.  No lower abdominal pain to suggest appendicitis, diverticulitis, PID.  Denies any symptoms of PID as well.  Labs ordered to evaluate for any evidence of the above.  Patient was given 1 L of fluid due to being slightly tachycardic as well as IV morphine, IV Zofran IV Protonix.  EKG was be read as atrial flutter but is definitely sinus. ? ?CBC shows elevated white count.  CMP is normal other than elevated T. bili.  Will fractionate it but seems less likely a retained stone.  Lipase is normal.  Urine without evidence of pregnancy.  Discussed with patient she is reporting  continued 6 out of 10 pain.  Will get CT imaging just to make sure evidence of perforation, obstruction.  We will give patient some Haldol to help with continued pain and nausea. ? ?IMPRESSION:  ?Diffuse small and large bowel fluid suggesting enteritis/diarrheal  ?illness. No bowel obstruction or bowel wall thickening.  ?   ?Hepatic steatosis.  ?   ? ? ?Patient's  direct bilirubin did not shows any significant bilirubin elevation.  Therefore I doubt that this is a retained stone in the early stages.  Her CT scan does show hepatic steatosis which could explain her elevated bilirubin.  We will have her follow-up this with her primary care doctor and I have explained the hepatic steatosis on CT imaging. ? ?There is a lot of fluid noted in the intestines.  Patient denies any diarrhea at this time.  She denies any risk factors for C. difficile and she is not immunosuppressive.  No indication for stool studies at this time.  Discussed with patient that I suspect she most likely will have some diarrhea going forward and that she most likely has a viral illness.  Given patient came in with significant abdominal pain with multiple episodes of vomiting I did consider admission but given patient is feeling much better on examination, labs are reassuring with negative CT scan I think it is reasonable for patient to be able to be discharged home with symptom treatment at home.  She still remains a little bit tachycardic so given some additional fluids, p.o. challenge and then anticipate discharge home. ? ?Urine is concerning for possible UTI.  Patient does report a little bit of increased urinary frequency therefore will start on antibiotics.  Patient feeling much better and is requesting discharge home.  We will send urine for culture.  ? ? ?The patient is on the cardiac monitor to evaluate for evidence of arrhythmia and/or significant heart rate changes. ? ? ?FINAL CLINICAL IMPRESSION(S) / ED DIAGNOSES  ? ?Final diagnoses:   ?Gastroenteritis  ?Acute cystitis without hematuria  ? ? ? ?Rx / DC Orders  ? ?ED Discharge Orders   ? ?      Ordered  ?  ondansetron (ZOFRAN-ODT) 4 MG disintegrating tablet  Every 8 hours PRN       ? 02/12/22 0859  ? ?  ?  ? ?  ? ? ? ?  Note:  This document was prepared using Dragon voice recognition software and may include unintentional dictation errors. ?  ?Concha Se, MD ?02/12/22 0900 ? ?  ?Concha Se, MD ?02/12/22 0945 ? ?

## 2022-02-12 NOTE — ED Triage Notes (Signed)
Pt reports epigastric pain and that she vomited 10 times this morning. Pt is ambulatory and vital signs WNL. Hx of pancreatitis and states the pain feels similar. ?

## 2022-02-12 NOTE — Discharge Instructions (Addendum)
I suspect you most likely have a viral GI bug vs UTI. She take the Zofran to help with nausea you can take Tylenol 1 g every 8 hours.  Try to drink Pedialyte, Gatorade without sugar to stay well-hydrated and to have the right electrolytes.  Return to the ER if develop worsening symptoms or any other concern.  If you start having a lot of diarrhea you can use some Imodium.  Your CT scan is as below-please follow-up with your primary care doctor for further work-up of your abnormal liver function test.  Your total bilirubin is abnormal and this needs to be followed up with them ? ?IMPRESSION:  ?Diffuse small and large bowel fluid suggesting enteritis/diarrheal  ?illness. No bowel obstruction or bowel wall thickening.  ?   ?Hepatic steatosis.  ?   ? ?

## 2022-02-13 LAB — URINE CULTURE

## 2022-08-04 IMAGING — CT CT ABD-PELV W/ CM
2 of 4 series · 16 of 46 positions shown, 18 images · IV contrast (APPLIED)
Comparison: 11/03/2015

CLINICAL DATA: Acute, nonlocalized abdominal pain

EXAM:
CT ABDOMEN AND PELVIS WITH CONTRAST
TECHNIQUE: Multidetector CT imaging of the abdomen and pelvis was performed
using the standard protocol following bolus administration of
intravenous contrast.

[Series 2: abdomen 5.0 · axial · 0.93mm/px · z∈[-882,-428]mm · 13 of 105 slices shown, 15 images]
[im 7/105  soft-tissue]
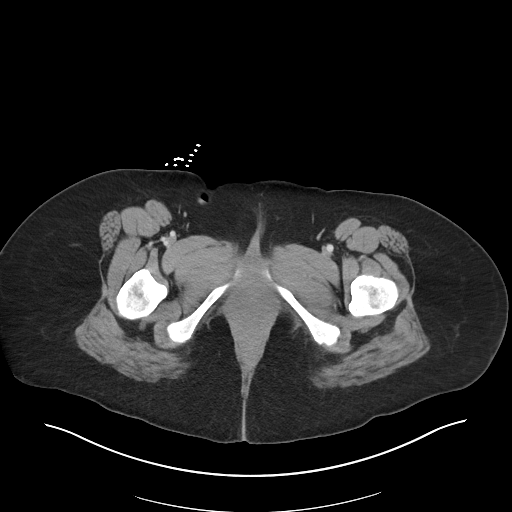
[im 7/105  bone]
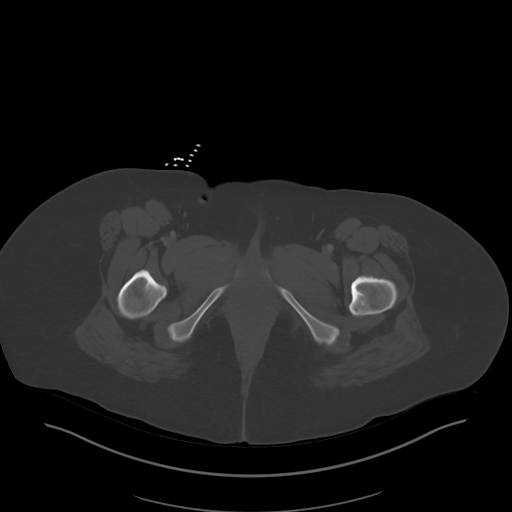
[im 13/105  soft-tissue]
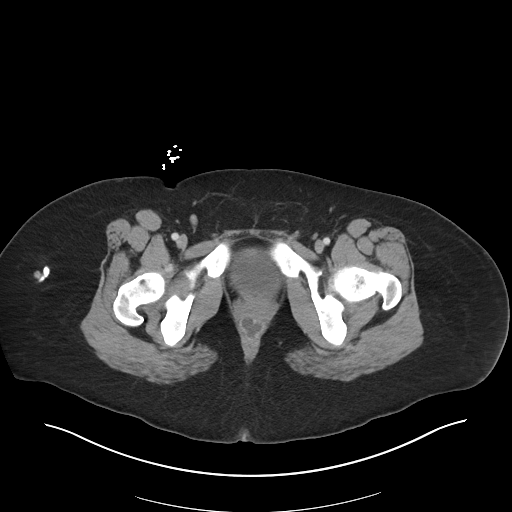
[im 25/105  soft-tissue]
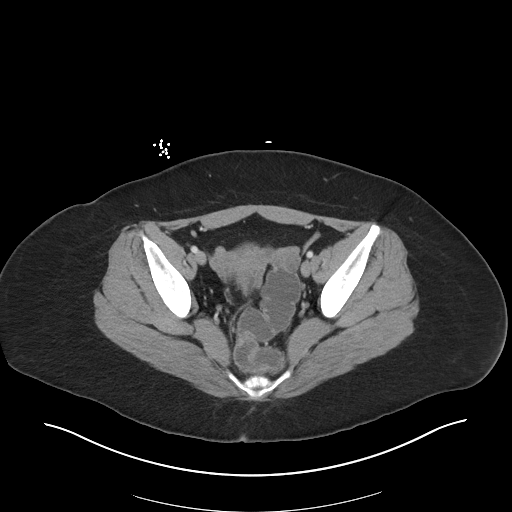
[im 31/105  soft-tissue]
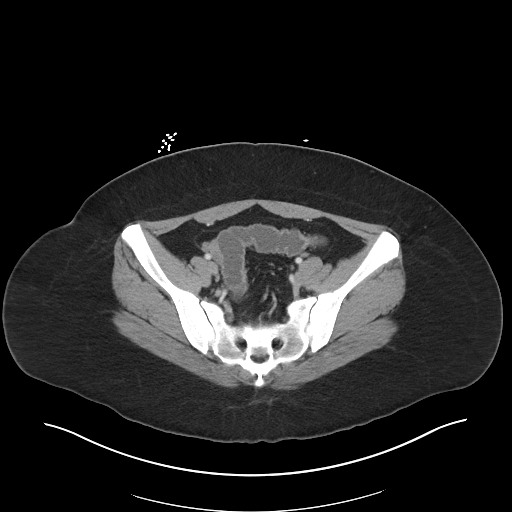
[im 37/105  soft-tissue]
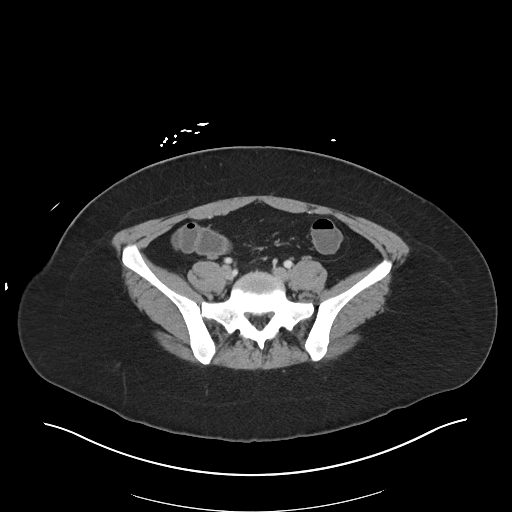
[im 43/105  soft-tissue]
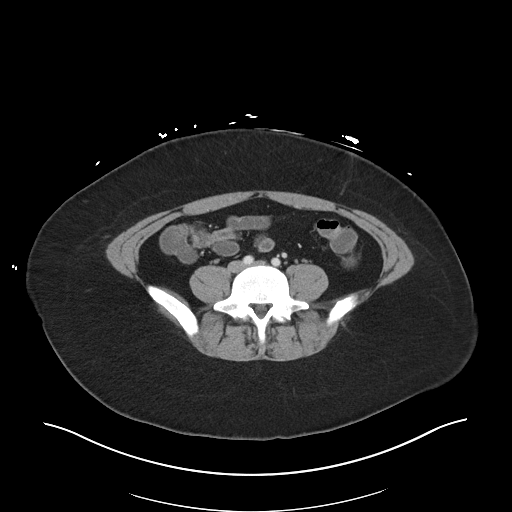
[im 56/105  soft-tissue]
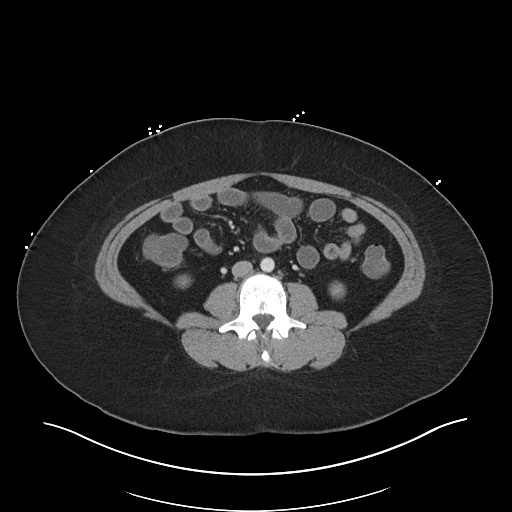
[im 62/105  soft-tissue]
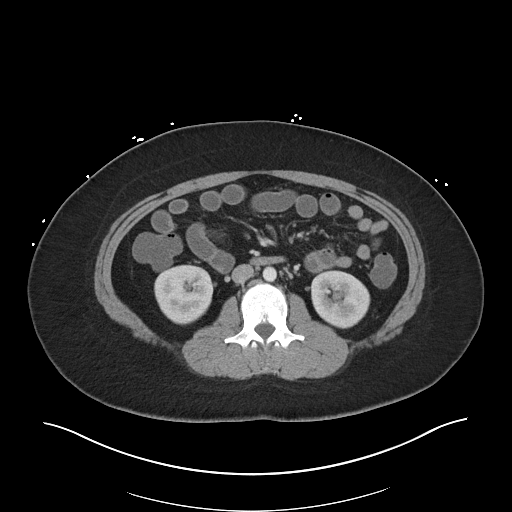
[im 68/105  soft-tissue]
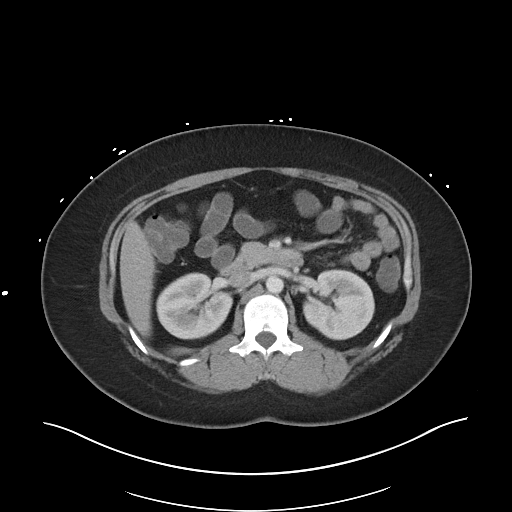
[im 68/105  bone]
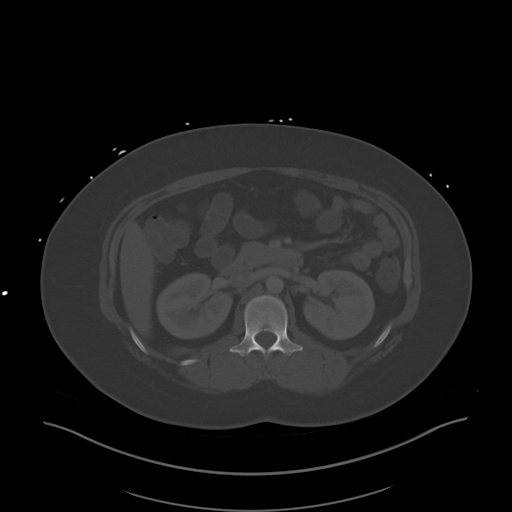
[im 74/105  soft-tissue]
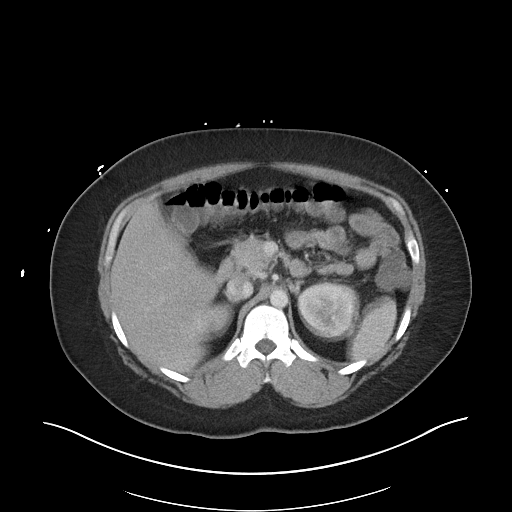
[im 80/105  soft-tissue]
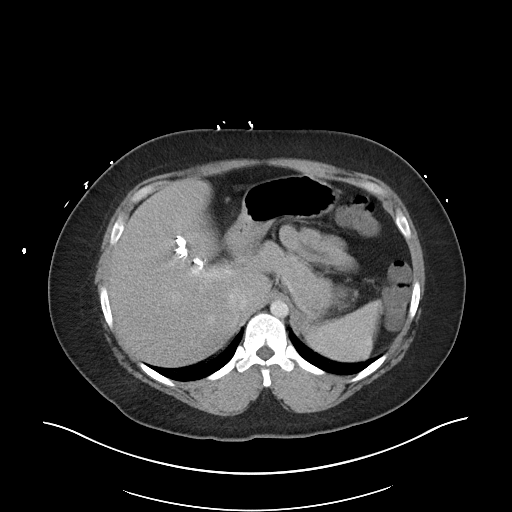
[im 92/105  soft-tissue]
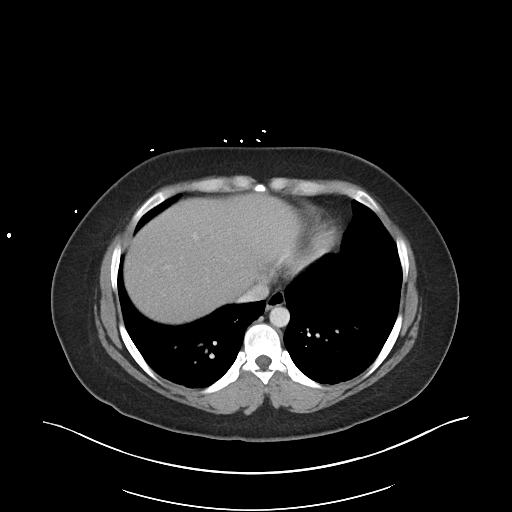
[im 98/105  soft-tissue]
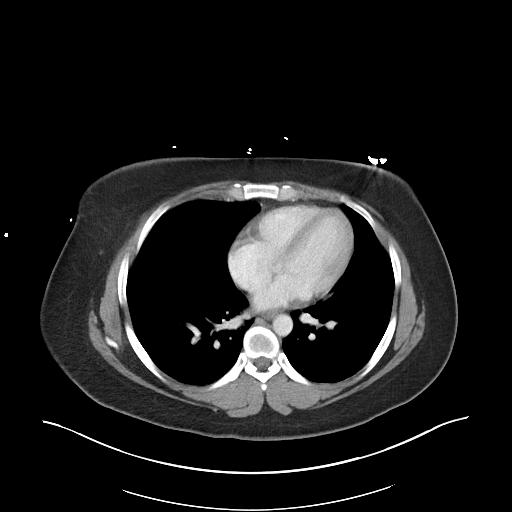

[Series 5: abdomen 3.0 mpr cor · coronal · 0.81mm/px · 3 of 104 slices shown]
[im 35/104  soft-tissue]
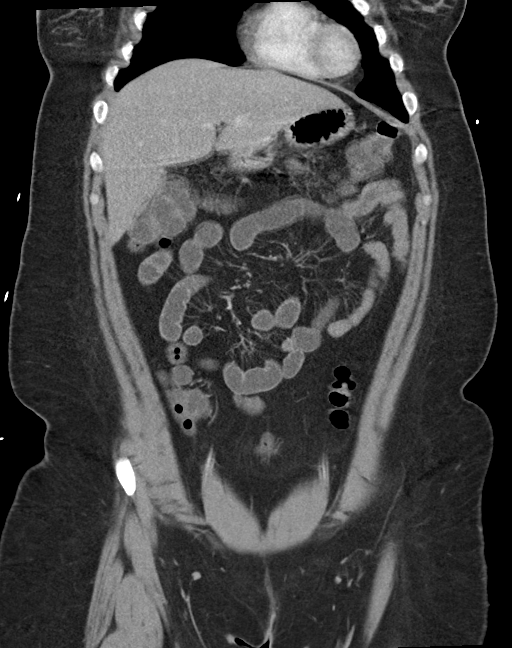
[im 46/104  soft-tissue]
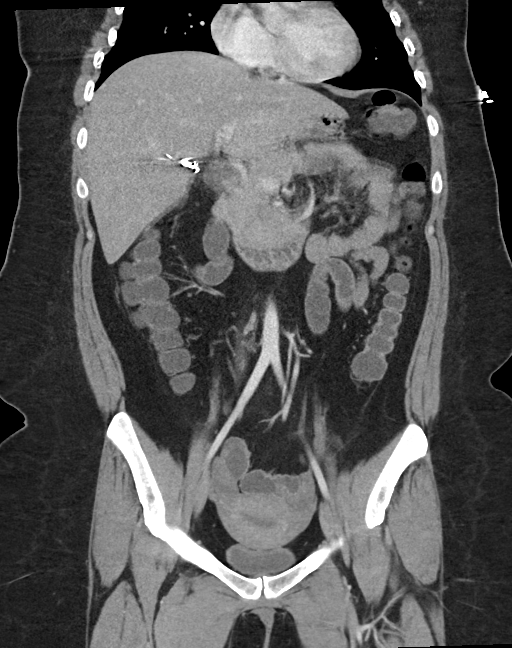
[im 58/104  soft-tissue]
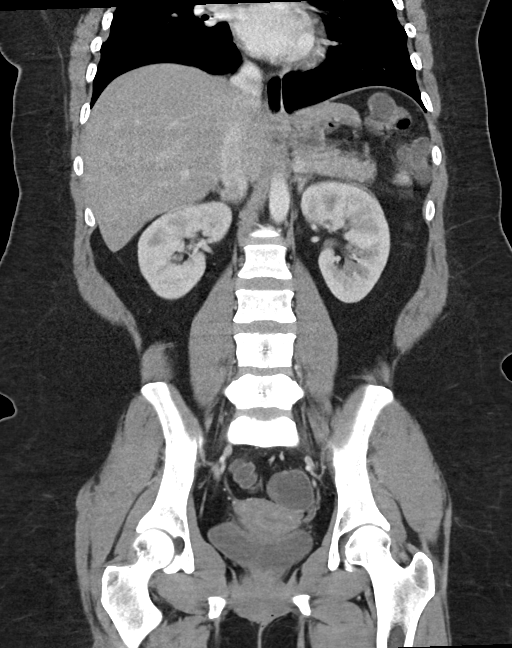

[16 of 46 positions shown; findings below may reference images not displayed]

RADIATION DOSE REDUCTION: This exam was performed according to the
departmental dose-optimization program which includes automated
exposure control, adjustment of the mA and/or kV according to
patient size and/or use of iterative reconstruction technique.

CONTRAST:  80mL OMNIPAQUE IOHEXOL 300 MG/ML  SOLN
FINDINGS: Lower chest:  No contributory findings.

Hepatobiliary: Hepatic steatosis.Cholecystectomy.

Pancreas: Unremarkable.

Spleen: Unremarkable.

Adrenals/Urinary Tract: Negative adrenals. No hydronephrosis or
stone. Unremarkable bladder.

Stomach/Bowel: Diffuse fluid-filled small bowel. Diffuse colonic
fluid fluid reaching the rectum.

Vascular/Lymphatic: No acute vascular abnormality. No mass or
adenopathy.

Reproductive:No pathologic findings.

Other: No ascites or pneumoperitoneum.

Musculoskeletal: No acute abnormalities.

Intermittent motion artifact.
IMPRESSION: Diffuse small and large bowel fluid suggesting enteritis/diarrheal
illness. No bowel obstruction or bowel wall thickening.

Hepatic steatosis.

## 2022-09-07 ENCOUNTER — Encounter: Payer: Self-pay | Admitting: Obstetrics and Gynecology
